# Patient Record
Sex: Female | Born: 1945 | Race: White | Hispanic: No | Smoking: Never smoker
Health system: Southern US, Community
[De-identification: ages and names within clinical notes are randomized; demographics above are authoritative.]

## PROBLEM LIST (undated history)

## (undated) DIAGNOSIS — F32A Depression, unspecified: Secondary | ICD-10-CM

## (undated) DIAGNOSIS — H25819 Combined forms of age-related cataract, unspecified eye: Secondary | ICD-10-CM

## (undated) DIAGNOSIS — M5126 Other intervertebral disc displacement, lumbar region: Secondary | ICD-10-CM

## (undated) DIAGNOSIS — N3000 Acute cystitis without hematuria: Secondary | ICD-10-CM

## (undated) DIAGNOSIS — R251 Tremor, unspecified: Secondary | ICD-10-CM

## (undated) DIAGNOSIS — E1122 Type 2 diabetes mellitus with diabetic chronic kidney disease: Secondary | ICD-10-CM

## (undated) DIAGNOSIS — M109 Gout, unspecified: Secondary | ICD-10-CM

## (undated) DIAGNOSIS — L409 Psoriasis, unspecified: Secondary | ICD-10-CM

## (undated) DIAGNOSIS — Z8659 Personal history of other mental and behavioral disorders: Secondary | ICD-10-CM

## (undated) DIAGNOSIS — R002 Palpitations: Secondary | ICD-10-CM

## (undated) DIAGNOSIS — H9190 Unspecified hearing loss, unspecified ear: Secondary | ICD-10-CM

## (undated) DIAGNOSIS — K760 Fatty (change of) liver, not elsewhere classified: Secondary | ICD-10-CM

## (undated) DIAGNOSIS — N183 Chronic kidney disease, stage 3 unspecified: Secondary | ICD-10-CM

## (undated) DIAGNOSIS — M48061 Spinal stenosis, lumbar region without neurogenic claudication: Secondary | ICD-10-CM

## (undated) DIAGNOSIS — M722 Plantar fascial fibromatosis: Secondary | ICD-10-CM

## (undated) DIAGNOSIS — M5417 Radiculopathy, lumbosacral region: Secondary | ICD-10-CM

## (undated) DIAGNOSIS — R351 Nocturia: Secondary | ICD-10-CM

## (undated) DIAGNOSIS — E039 Hypothyroidism, unspecified: Secondary | ICD-10-CM

## (undated) DIAGNOSIS — F319 Bipolar disorder, unspecified: Secondary | ICD-10-CM

## (undated) DIAGNOSIS — F329 Major depressive disorder, single episode, unspecified: Secondary | ICD-10-CM

## (undated) DIAGNOSIS — R259 Unspecified abnormal involuntary movements: Secondary | ICD-10-CM

## (undated) DIAGNOSIS — M1990 Unspecified osteoarthritis, unspecified site: Secondary | ICD-10-CM

## (undated) DIAGNOSIS — F40240 Claustrophobia: Secondary | ICD-10-CM

## (undated) DIAGNOSIS — R300 Dysuria: Secondary | ICD-10-CM

## (undated) DIAGNOSIS — E119 Type 2 diabetes mellitus without complications: Secondary | ICD-10-CM

## (undated) DIAGNOSIS — H353130 Nonexudative age-related macular degeneration, bilateral, stage unspecified: Secondary | ICD-10-CM

## (undated) DIAGNOSIS — N289 Disorder of kidney and ureter, unspecified: Secondary | ICD-10-CM

## (undated) DIAGNOSIS — I2510 Atherosclerotic heart disease of native coronary artery without angina pectoris: Secondary | ICD-10-CM

## (undated) DIAGNOSIS — R739 Hyperglycemia, unspecified: Secondary | ICD-10-CM

## (undated) DIAGNOSIS — H43819 Vitreous degeneration, unspecified eye: Secondary | ICD-10-CM

## (undated) DIAGNOSIS — M25559 Pain in unspecified hip: Secondary | ICD-10-CM

## (undated) DIAGNOSIS — H409 Unspecified glaucoma: Secondary | ICD-10-CM

## (undated) DIAGNOSIS — R911 Solitary pulmonary nodule: Secondary | ICD-10-CM

## (undated) DIAGNOSIS — H3530 Unspecified macular degeneration: Secondary | ICD-10-CM

## (undated) DIAGNOSIS — M5414 Radiculopathy, thoracic region: Secondary | ICD-10-CM

## (undated) DIAGNOSIS — R81 Glycosuria: Secondary | ICD-10-CM

## (undated) DIAGNOSIS — R479 Unspecified speech disturbances: Secondary | ICD-10-CM

## (undated) HISTORY — PX: TOTAL HIP ARTHROPLASTY: SHX124

## (undated) HISTORY — DX: Claustrophobia: F40.240

## (undated) HISTORY — DX: Chronic kidney disease, stage 3 unspecified: N18.30

## (undated) HISTORY — DX: Bipolar disorder, unspecified: F31.9

## (undated) HISTORY — DX: Unspecified abnormal involuntary movements: R25.9

## (undated) HISTORY — DX: Personal history of other mental and behavioral disorders: Z86.59

## (undated) HISTORY — DX: Disorder of kidney and ureter, unspecified: N28.9

## (undated) HISTORY — DX: Vitreous degeneration, unspecified eye: H43.819

## (undated) HISTORY — DX: Tremor, unspecified: R25.1

## (undated) HISTORY — DX: Solitary pulmonary nodule: R91.1

## (undated) HISTORY — DX: Type 2 diabetes mellitus without complications: E11.9

## (undated) HISTORY — PX: CATARACT EXTRACTION: SUR2

## (undated) HISTORY — PX: APPENDECTOMY: SHX54

## (undated) HISTORY — DX: Pain in unspecified hip: M25.559

## (undated) HISTORY — DX: Gout, unspecified: M10.9

## (undated) HISTORY — DX: Fatty (change of) liver, not elsewhere classified: K76.0

## (undated) HISTORY — DX: Hyperglycemia, unspecified: R73.9

## (undated) HISTORY — DX: Unspecified glaucoma: H40.9

## (undated) HISTORY — DX: Palpitations: R00.2

## (undated) HISTORY — DX: Nonexudative age-related macular degeneration, bilateral, stage unspecified: H35.3130

## (undated) HISTORY — DX: Unspecified osteoarthritis, unspecified site: M19.90

## (undated) HISTORY — DX: Other intervertebral disc displacement, lumbar region: M51.26

## (undated) HISTORY — DX: Spinal stenosis, lumbar region without neurogenic claudication: M48.061

## (undated) HISTORY — DX: Unspecified macular degeneration: H35.30

## (undated) HISTORY — DX: Plantar fascial fibromatosis: M72.2

## (undated) HISTORY — DX: Nocturia: R35.1

## (undated) HISTORY — DX: Glycosuria: R81

## (undated) HISTORY — DX: Atherosclerotic heart disease of native coronary artery without angina pectoris: I25.10

## (undated) HISTORY — DX: Unspecified speech disturbances: R47.9

## (undated) HISTORY — DX: Hypercalcemia: E83.52

## (undated) HISTORY — PX: OTHER SURGICAL HISTORY: SHX169

## (undated) HISTORY — DX: Type 2 diabetes mellitus with diabetic chronic kidney disease: E11.22

## (undated) HISTORY — PX: BACK SURGERY: SHX140

## (undated) HISTORY — DX: Unspecified hearing loss, unspecified ear: H91.90

## (undated) HISTORY — DX: Psoriasis, unspecified: L40.9

## (undated) HISTORY — DX: Chronic kidney disease, stage 3 (moderate): N18.3

## (undated) HISTORY — DX: Acute cystitis without hematuria: N30.00

## (undated) HISTORY — DX: Combined forms of age-related cataract, unspecified eye: H25.819

## (undated) HISTORY — DX: Radiculopathy, lumbosacral region: M54.17

## (undated) HISTORY — DX: Hypothyroidism, unspecified: E03.9

## (undated) HISTORY — DX: Major depressive disorder, single episode, unspecified: F32.9

## (undated) HISTORY — DX: Depression, unspecified: F32.A

## (undated) HISTORY — DX: Dysuria: R30.0

## (undated) HISTORY — DX: Radiculopathy, thoracic region: M54.14

## (undated) HISTORY — PX: NECK SURGERY: SHX720

---

## 2003-11-16 ENCOUNTER — Encounter: Payer: Self-pay | Admitting: Internal Medicine

## 2004-04-11 ENCOUNTER — Encounter: Payer: Self-pay | Admitting: Internal Medicine

## 2009-11-14 HISTORY — PX: LAMINECTOMY: SHX219

## 2010-12-09 LAB — HM PAP SMEAR: HM Pap smear: NORMAL

## 2011-03-12 LAB — HM COLONOSCOPY

## 2011-06-29 ENCOUNTER — Encounter: Payer: Self-pay | Admitting: Internal Medicine

## 2011-06-29 LAB — HM DIABETES EYE EXAM

## 2012-05-11 ENCOUNTER — Encounter: Payer: Self-pay | Admitting: Internal Medicine

## 2012-05-18 ENCOUNTER — Encounter: Payer: Self-pay | Admitting: Internal Medicine

## 2012-10-17 LAB — HM DIABETES EYE EXAM

## 2013-04-18 ENCOUNTER — Encounter: Payer: Self-pay | Admitting: Internal Medicine

## 2013-04-18 HISTORY — PX: HYSTEROSCOPY: SHX211

## 2013-04-27 LAB — HEPATIC FUNCTION PANEL
ALK PHOS: 104 (ref 25–125)
ALT: 31 (ref 7–35)
AST: 18 (ref 13–35)
Bilirubin, Total: 0.7

## 2013-04-27 LAB — BASIC METABOLIC PANEL
BUN: 12 (ref 4–21)
CREATININE: 1.4 — AB (ref 0.5–1.1)
Glucose: 154
POTASSIUM: 5.2 (ref 3.4–5.3)
Sodium: 139 (ref 137–147)

## 2013-05-18 ENCOUNTER — Encounter: Payer: Self-pay | Admitting: Internal Medicine

## 2013-07-10 LAB — HM MAMMOGRAPHY

## 2014-01-10 LAB — HEPATIC FUNCTION PANEL
ALK PHOS: 110 (ref 25–125)
ALT: 21 (ref 7–35)
AST: 18 (ref 13–35)
BILIRUBIN, TOTAL: 0.7

## 2014-01-10 LAB — BASIC METABOLIC PANEL
BUN: 15 (ref 4–21)
Creatinine: 1.5 — AB (ref 0.5–1.1)
GLUCOSE: 145
POTASSIUM: 4.9 (ref 3.4–5.3)
SODIUM: 144 (ref 137–147)

## 2014-01-10 LAB — TSH: TSH: 0.02 — AB (ref 0.41–5.90)

## 2014-05-25 ENCOUNTER — Encounter: Payer: Self-pay | Admitting: Internal Medicine

## 2014-07-10 LAB — HEPATIC FUNCTION PANEL
ALT: 24 (ref 7–35)
AST: 26 (ref 13–35)
Alkaline Phosphatase: 110 (ref 25–125)
BILIRUBIN, TOTAL: 0.7

## 2014-07-10 LAB — BASIC METABOLIC PANEL
BUN: 13 (ref 4–21)
Creatinine: 1.5 — AB (ref 0.5–1.1)
GLUCOSE: 140
Potassium: 5 (ref 3.4–5.3)
Sodium: 142 (ref 137–147)

## 2014-07-10 LAB — TSH: TSH: 3.86 (ref 0.41–5.90)

## 2014-07-11 ENCOUNTER — Encounter: Payer: Self-pay | Admitting: Internal Medicine

## 2014-11-23 ENCOUNTER — Encounter: Payer: Self-pay | Admitting: Internal Medicine

## 2014-12-10 ENCOUNTER — Encounter: Payer: Self-pay | Admitting: Internal Medicine

## 2017-06-02 ENCOUNTER — Encounter: Payer: Self-pay | Admitting: Internal Medicine

## 2017-07-12 LAB — HM MAMMOGRAPHY

## 2018-02-23 ENCOUNTER — Ambulatory Visit: Payer: Self-pay | Admitting: Internal Medicine

## 2018-03-03 ENCOUNTER — Other Ambulatory Visit: Payer: Self-pay

## 2018-03-08 ENCOUNTER — Encounter: Payer: Self-pay | Admitting: Internal Medicine

## 2018-03-08 ENCOUNTER — Ambulatory Visit (INDEPENDENT_AMBULATORY_CARE_PROVIDER_SITE_OTHER): Payer: Medicare Other | Admitting: Internal Medicine

## 2018-03-08 VITALS — BP 104/60 | HR 69 | Temp 97.9°F | Ht 64.0 in | Wt 240.6 lb

## 2018-03-08 DIAGNOSIS — E118 Type 2 diabetes mellitus with unspecified complications: Secondary | ICD-10-CM

## 2018-03-08 DIAGNOSIS — E032 Hypothyroidism due to medicaments and other exogenous substances: Secondary | ICD-10-CM

## 2018-03-08 DIAGNOSIS — Z794 Long term (current) use of insulin: Secondary | ICD-10-CM

## 2018-03-08 DIAGNOSIS — G4701 Insomnia due to medical condition: Secondary | ICD-10-CM | POA: Diagnosis not present

## 2018-03-08 DIAGNOSIS — E782 Mixed hyperlipidemia: Secondary | ICD-10-CM | POA: Insufficient documentation

## 2018-03-08 DIAGNOSIS — M48062 Spinal stenosis, lumbar region with neurogenic claudication: Secondary | ICD-10-CM | POA: Insufficient documentation

## 2018-03-08 DIAGNOSIS — F3342 Major depressive disorder, recurrent, in full remission: Secondary | ICD-10-CM

## 2018-03-08 DIAGNOSIS — N189 Chronic kidney disease, unspecified: Secondary | ICD-10-CM

## 2018-03-08 DIAGNOSIS — R600 Localized edema: Secondary | ICD-10-CM

## 2018-03-08 MED ORDER — ARIPIPRAZOLE 5 MG PO TABS: 5.0000 mg | ORAL_TABLET | Freq: Every day | ORAL | 6 refills | Status: DC

## 2018-03-08 MED ORDER — TRAZODONE HCL 50 MG PO TABS: 25.0000 mg | ORAL_TABLET | Freq: Every evening | ORAL | 3 refills | Status: DC | PRN

## 2018-03-08 NOTE — Patient Instructions (Addendum)
REDUCE ABILIFY TO 5MG   REDUCE TRAZODONE TO 25MG  AT BEDTIME  STOP ALPRAZOLAM  START OTC MELATONIN 6MG  AT BEDTIME  Continue other medications as ordered  Will call with lab results   Please schedule appt with mental health provider  Follow up in 1 month for depression

## 2018-03-08 NOTE — Progress Notes (Signed)
Patient ID: Bonna Steury, female   DOB: [birth year], 72 y.o.   MRN: [id number]   Location:  [facility] OFFICE  Provider: DR Arletha Grippe  Code Status:  Goals of Care: No flowsheet data found.   Chief Complaint  Patient presents with  . Establish Care    New patient establish care; Pt c/o nerve pain in thigh, toes and heel just on L leg; Pt has daughter Ander Purpura with her  . Medication Management    Pt brought all med's  . Medication Refill    Xanax    HPI: Patient is a 72 y.o. female seen today for as a new pt. She relocated [place] in Feb 2019. Briefly reviewed old records. She last saw PCP in Jan 2019. She saw Ortho in Jan 2019 and rec'd Toradol injection. She has severe central canal stenosis at L3-L4 above her fusion. She is a poor hitorian due to psych d/o. Hx obtained from chart.  She c/a left thigh nerve pain -->heel about 1 month ago. No known trauma or injury. She has chronic LBP. Last MRI "years" ago. She had back sx including rod placement about 5 yrs ago. She had lower cervical neck fusion about 20 yrs ago. She reports past hx DDD and possibly spinal stenosis. She takes gabapentin prn as it causes sedation/lethargy. She wonders if hemp would help her pain better?  Bipolar d/o, manic, moderate - she reports misdiagnosed and she no longer takes lithium. She has been taking celexa, abilify, and trazodone. She takes alprazolam to help her sleep. She would like to stop abilify. She was seeing psych in [place]  DM - BS 50-120s. SHE GETS LANTUS 40 UNITS DAILY AND januvia Last A1C 11.5%. Diet has become more healthy since relocation.  CKD - has seen nephrology in the past. 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 stable on levothyroxine  Hyperlipidemia - takes simvastatin  LE edema - stable on prn furosemide   Past Medical History:  Diagnosis Date  . Abnormal involuntary movement   . Acute cystitis   . Arthralgia of pelvic region and thigh   . Bilateral  nonexudative age-related macular degeneration   . Bipolar disorder ([place])   . Claustrophobia   . Combined form of senile cataract   . Coronary arteriosclerosis   . Decreased hearing   . Depression   . Diabetes ([place])   . Displacement of lumbar intervertebral disc without myelopathy   . Dysuria   . Glaucoma   . Glycosuria   . Gout   . History of mania   . Hypercalcemia   . Hyperglycemia   . Hypothyroidism   . Kidney disease   . Macular degeneration   . Nocturia   . 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 Palpitations   . Plantar fasciitis   . Psoriasis   . Solitary pulmonary nodule present on computed tomography of lung   . Speaking difficulty   . Spinal stenosis of lumbar region   . Stage 3 chronic renal impairment associated with type 2 diabetes mellitus ([place])   . Thoracic and lumbosacral neuritis   . Tremor   . Vitreous degeneration     Past Surgical History:  Procedure Laterality Date  . APPENDECTOMY    . BACK SURGERY     rods placed  . carpel tunnel     right in 1996, left in 1994  . CATARACT EXTRACTION    . HYSTEROSCOPY  04/18/2013  .  LAMINECTOMY  11/14/2009   L4-5 with fusion  . NECK SURGERY     4-5 disc  . TOTAL HIP ARTHROPLASTY Right      reports that she has never smoked. She has never used smokeless tobacco. She reports that she does not drink alcohol or use drugs. Social History   Socioeconomic History  . Marital status: Divorced    Spouse name: Not on file  . Number of children: Not on file  . Years of education: Not on file  . Highest education level: Not on file  Occupational History  . Not on file  Social Needs  . Financial resource strain: Not on file  . Food insecurity:    Worry: Not on file    Inability: Not on file  . Transportation needs:    Medical: Not on file    Non-medical: Not on file  Tobacco Use  . Smoking status: Never Smoker  . Smokeless tobacco: Never Used  Substance and Sexual  Activity  . Alcohol use: No    Frequency: Never  . Drug use: No  . Sexual activity: Not Currently  Lifestyle  . Physical activity:    Days per week: Not on file    Minutes per session: Not on file  . Stress: Not on file  Relationships  . Social connections:    Talks on phone: Not on file    Gets together: Not on file    Attends religious service: Not on file    Active member of club or organization: Not on file    Attends meetings of clubs or organizations: Not on file    Relationship status: Not on file  . Intimate partner violence:    Fear of current or ex partner: Not on file    Emotionally abused: Not on file    Physically abused: Not on file    Forced sexual activity: Not on file  Other Topics Concern  . Not on file  Social History Narrative   Social History      Diet?      Do you drink/eat things with caffeine? Coffee, soda      Marital status?            divorced                        What year were you married? 1969      Do you live in a house, apartment, assisted living, condo, trailer, etc.? house      Is it one or more stories? 2 story      How many persons live in your home? 2      Do you have any pets in your home? (please list) 1 dog, 3 cats      Highest level of education completed? Beautician school      Current or past profession: Insurance claims handler, bus driver (school)      Do you exercise?    no                                  Type & how often?      Advanced Directives      Do you have a living will? no      Do you have a DNR form?  If not, do you want to discuss one? no      Do you have signed POA/HPOA for forms?  yes      Functional Status      Do you have difficulty bathing or dressing yourself? no      Do you have difficulty preparing food or eating? no      Do you have difficulty managing your medications? no      Do you have difficulty managing your finances? no      Do you have difficulty affording  your medications? no    Family History  Problem Relation Age of Onset  . Peripheral Artery Disease Mother   . Cancer Father     Allergies  Allergen Reactions  . Metformin And Related Diarrhea  . Ultram [Tramadol] Other (See Comments)    falls  . Penicillins Rash    Outpatient Encounter Medications as of 03/08/2018  Medication Sig  . ALPRAZolam (XANAX) 0.5 MG tablet Take 0.5 mg by mouth daily.  . ARIPiprazole (ABILIFY) 10 MG tablet Take 10 mg by mouth daily.  . Ascorbic Acid (VITAMIN C) 1000 MG tablet Take 1,000 mg by mouth 2 (two) times daily.  . citalopram (CELEXA) 40 MG tablet Take 40 mg by mouth daily.  . clotrimazole (LOTRIMIN) 1 % cream Apply 1 application topically daily as needed.  . fluticasone (FLONASE) 50 MCG/ACT nasal spray Place 1 spray into both nostrils 2 (two) times daily.  . furosemide (LASIX) 20 MG tablet Take 20 mg by mouth daily.  Marland Kitchen gabapentin (NEURONTIN) 300 MG capsule Take 300 mg by mouth daily as needed.  . Insulin Glargine (LANTUS SOLOSTAR) 100 UNIT/ML Solostar Pen Inject 40 Units into the skin daily at 10 pm.  . levothyroxine (SYNTHROID, LEVOTHROID) 112 MCG tablet Take 112 mcg by mouth daily before breakfast.  . Multiple Vitamins-Minerals (PRESERVISION AREDS 2+MULTI VIT PO) Take 1 tablet by mouth 2 (two) times daily.  . simvastatin (ZOCOR) 40 MG tablet Take 40 mg by mouth daily.  . sitaGLIPtin (JANUVIA) 100 MG tablet Take 100 mg by mouth daily.  . traZODone (DESYREL) 100 MG tablet Take 100 mg by mouth at bedtime.   No facility-administered encounter medications on file as of 03/08/2018.     Review of Systems:  Review of Systems  HENT: Positive for dental problem ([place]), hearing loss and sinus pressure.        Dry mouth  Eyes: Positive for visual disturbance (due to macukar degeneration).       Dry eyes  Musculoskeletal: Positive for back pain.  Neurological: Positive for weakness and numbness.  Psychiatric/Behavioral: Positive for dysphoric mood and  sleep disturbance (insomnia). The patient is nervous/anxious (with panic attacks).   All other systems reviewed and are negative. per NP packet  Health Maintenance  Topic Date Due  . Hepatitis C Screening  1946-04-04  . DEXA SCAN  03/29/2011  . PNA vac Low Risk Adult (2 of 2 - PCV13) 08/10/2014  . INFLUENZA VACCINE  07/07/2018  . MAMMOGRAM  07/13/2019  . TETANUS/TDAP  12/09/2020  . COLONOSCOPY  03/11/2021    Physical Exam: Vitals:   03/08/18 0855  BP: 104/60  Pulse: 69  Temp: 97.9 F (36.6 C)  TempSrc: Oral  SpO2: 93%  Weight: 240 lb 9.6 oz (109.1 kg)  Height: '5\' 4"'$  (1.626 m)   Body mass index is 41.3 kg/m. Physical Exam  Constitutional: She is oriented to person, place, and time. She appears well-developed and well-nourished.  HENT:  Mouth/Throat: Oropharynx is clear and moist. No oropharyngeal exudate.  MMM; no oral thrush  Eyes: Pupils are equal, round, and reactive to light. No scleral icterus.  Neck: Neck supple. Carotid bruit is not present. No tracheal deviation present. No thyromegaly present.  Cardiovascular: Normal rate, regular rhythm, normal heart sounds and intact distal pulses. Exam reveals no gallop and no friction rub.  No murmur heard. +1 pitting LE edema b/l. No calf TTP; chronic venous stasis changes  Pulmonary/Chest: Effort normal and breath sounds normal. No stridor. No respiratory distress. She has no wheezes. She has no rales.  Abdominal: Soft. Normal appearance and bowel sounds are normal. She exhibits no distension and no mass. There is no hepatomegaly. There is no tenderness. There is no rigidity, no rebound and no guarding. No hernia.  Musculoskeletal: She exhibits edema.  Lymphadenopathy:    She has no cervical adenopathy.  Neurological: She is alert and oriented to person, place, and time.  Skin: Skin is warm and dry. No rash noted.  Psychiatric: She has a normal mood and affect. Her behavior is normal. Thought content normal.   Diabetic  Foot Exam - Simple   Simple Foot Form Diabetic Foot exam was performed with the following findings:  Yes 03/08/2018  9:56 AM  Visual Inspection See comments:  Yes Sensation Testing Intact to touch and monofilament testing bilaterally:  Yes Pulse Check Posterior Tibialis and Dorsalis pulse intact bilaterally:  Yes Comments (+) calluses b/l foot but no ulcerations; hammer toes present; b/l +1 pitting  BLE edema     Labs reviewed: Basic Metabolic Panel: No results for input(s): NA, K, CL, CO2, GLUCOSE, BUN, CREATININE, CALCIUM, MG, PHOS, TSH in the last 8760 hours. Liver Function Tests: No results for input(s): AST, ALT, ALKPHOS, BILITOT, PROT, ALBUMIN in the last 8760 hours. No results for input(s): LIPASE, AMYLASE in the last 8760 hours. No results for input(s): AMMONIA in the last 8760 hours. CBC: No results for input(s): WBC, NEUTROABS, HGB, HCT, MCV, PLT in the last 8760 hours. Lipid Panel: No results for input(s): CHOL, HDL, LDLCALC, TRIG, CHOLHDL, LDLDIRECT in the last 8760 hours. No results found for: HGBA1C  Procedures since last visit: No results found.  Assessment/Plan   ICD-10-CM   1. Recurrent major depressive disorder, in full remission ([place]) F33.42 ARIPiprazole (ABILIFY) 5 MG tablet  2. Insomnia due to medical condition G47.01 traZODone (DESYREL) 50 MG tablet  3. Hypothyroidism due to medication E03.2 TSH    T4, Free  4. Type 2 diabetes mellitus with complication, with long-term current use of insulin (HCC) E11.8 Hemoglobin A1c   Z79.4 CMP with eGFR([facility])    Microalbumin/Creatinine Ratio, Urine    Urinalysis with Reflex Microscopic    CBC with Differential/Platelets  5. Mixed hyperlipidemia E78.2 Lipid Panel  6. Bilateral lower extremity edema R60.0   7. Chronic kidney disease, unspecified CKD stage N18.9 CMP with eGFR([facility])    CBC with Differential/Platelets  8. Spinal stenosis of lumbar region with neurogenic claudication M48.062     Ok to use hemp as  directed for pain  REDUCE ABILIFY TO '5MG'$   REDUCE TRAZODONE TO '25MG'$  AT BEDTIME  STOP ALPRAZOLAM  START OTC MELATONIN '6MG'$  AT BEDTIME  Continue other medications as ordered  Will call with lab results   Please schedule appt with mental health provider  Old records reviewed  Follow up in 1 month for depression  Glade Strausser S. Naryiah Schley, [facility] [phone]  [place], [place] [address] [phone] Cell (Monday-Friday 8 AM - 5 PM) [phone] After 5 PM and follow prompts

## 2018-03-09 ENCOUNTER — Telehealth: Payer: Self-pay

## 2018-03-09 DIAGNOSIS — E870 Hyperosmolality and hypernatremia: Secondary | ICD-10-CM

## 2018-03-09 LAB — MICROALBUMIN / CREATININE URINE RATIO
Creatinine, Urine: 81 mg/dL (ref 20–275)
MICROALB/CREAT RATIO: 19 ug/mg{creat} (ref <30)
Microalb, Ur: 1.5 mg/dL

## 2018-03-09 LAB — LIPID PANEL
CHOL/HDL RATIO: 3.9 (calc) (ref <5.0)
Cholesterol: 170 mg/dL (ref <200)
HDL: 44 mg/dL — ABNORMAL LOW (ref >50)
LDL CHOLESTEROL (CALC): 105 mg/dL — AB
NON-HDL CHOLESTEROL (CALC): 126 mg/dL (ref <130)
TRIGLYCERIDES: 116 mg/dL (ref <150)

## 2018-03-09 LAB — URINALYSIS, ROUTINE W REFLEX MICROSCOPIC
Bacteria, UA: NONE SEEN /HPF
Bilirubin Urine: NEGATIVE
Glucose, UA: NEGATIVE
HYALINE CAST: NONE SEEN /LPF
Hgb urine dipstick: NEGATIVE
Ketones, ur: NEGATIVE
Nitrite: NEGATIVE
PROTEIN: NEGATIVE
Specific Gravity, Urine: 1.017 (ref 1.001–1.03)
pH: 7 (ref 5.0–8.0)

## 2018-03-09 LAB — CBC WITH DIFFERENTIAL/PLATELET
Basophils Absolute: 58 cells/uL (ref 0–200)
Basophils Relative: 0.8 %
EOS ABS: 360 {cells}/uL (ref 15–500)
Eosinophils Relative: 5 %
HEMATOCRIT: 38.3 % (ref 35.0–45.0)
Hemoglobin: 12.5 g/dL (ref 11.7–15.5)
LYMPHS ABS: 1872 {cells}/uL (ref 850–3900)
MCH: 27.8 pg (ref 27.0–33.0)
MCHC: 32.6 g/dL (ref 32.0–36.0)
MCV: 85.3 fL (ref 80.0–100.0)
MPV: 10.3 fL (ref 7.5–12.5)
Monocytes Relative: 5.6 %
NEUTROS PCT: 62.6 %
Neutro Abs: 4507 cells/uL (ref 1500–7800)
PLATELETS: 313 10*3/uL (ref 140–400)
RBC: 4.49 10*6/uL (ref 3.80–5.10)
RDW: 14.2 % (ref 11.0–15.0)
TOTAL LYMPHOCYTE: 26 %
WBC mixed population: 403 cells/uL (ref 200–950)
WBC: 7.2 10*3/uL (ref 3.8–10.8)

## 2018-03-09 LAB — COMPLETE METABOLIC PANEL WITH GFR
AG Ratio: 1.7 (calc) (ref 1.0–2.5)
ALBUMIN MSPROF: 4 g/dL (ref 3.6–5.1)
ALT: 14 U/L (ref 6–29)
AST: 15 U/L (ref 10–35)
Alkaline phosphatase (APISO): 104 U/L (ref 33–130)
BUN / CREAT RATIO: 17 (calc) (ref 6–22)
BUN: 25 mg/dL (ref 7–25)
CALCIUM: 9.1 mg/dL (ref 8.6–10.4)
CO2: 29 mmol/L (ref 20–32)
Chloride: 110 mmol/L (ref 98–110)
Creat: 1.48 mg/dL — ABNORMAL HIGH (ref 0.60–0.93)
GFR, EST NON AFRICAN AMERICAN: 35 mL/min/{1.73_m2} — AB (ref >=60)
GFR, Est African American: 41 mL/min/{1.73_m2} — ABNORMAL LOW (ref >=60)
GLUCOSE: 64 mg/dL — AB (ref 65–139)
Globulin: 2.4 g/dL (calc) (ref 1.9–3.7)
Potassium: 5 mmol/L (ref 3.5–5.3)
Sodium: 149 mmol/L — ABNORMAL HIGH (ref 135–146)
Total Bilirubin: 0.4 mg/dL (ref 0.2–1.2)
Total Protein: 6.4 g/dL (ref 6.1–8.1)

## 2018-03-09 LAB — HEMOGLOBIN A1C
Hgb A1c MFr Bld: 8 % of total Hgb — ABNORMAL HIGH (ref <5.7)
Mean Plasma Glucose: 183 (calc)
eAG (mmol/L): 10.1 (calc)

## 2018-03-09 LAB — T4, FREE: FREE T4: 1.4 ng/dL (ref 0.8–1.8)

## 2018-03-09 LAB — TSH: TSH: 0.57 mIU/L (ref 0.40–4.50)

## 2018-03-09 MED ORDER — SITAGLIPTIN PHOSPHATE 50 MG PO TABS: 50.0000 mg | ORAL_TABLET | Freq: Every day | ORAL | 6 refills | Status: DC

## 2018-03-09 NOTE — Telephone Encounter (Signed)
-----   Message from FlorenceMonica Carter, [place] sent at 03/09/2018  9:15 AM EDT ----- Diabetes control improving - continue lantus 40 units but will reduce januvia to 50mg  daily due to reduced kidney function (Januvia 50mg  #30 take 1 tab po daily with 6 RF); sodium elevated suggests dehydration - push water intake; repeat BMP in 1 week (re: hypernatremia) bad/LDL cholesterol borderline so watch fatty foods; other labs stable; continue other medications as ordered; follow up as scheduled

## 2018-03-09 NOTE — Telephone Encounter (Signed)
Discussed results with patient, patient verbalized understanding of results. Patient's daughter was also listening to call via speakerphone   1.) Scheduled appointment for 03/16/18 to recheck BMP- follow-up on sodium 2.) Copy of labs and cholesterol lowering diet mailed to patient 3.) Placed future order for BMP  4.) New rx for Januvia sent to pharmacy for decreased dosage

## 2018-03-15 ENCOUNTER — Other Ambulatory Visit: Payer: Self-pay

## 2018-03-15 ENCOUNTER — Other Ambulatory Visit: Payer: Medicare Other

## 2018-03-15 DIAGNOSIS — E870 Hyperosmolality and hypernatremia: Secondary | ICD-10-CM

## 2018-03-15 LAB — BASIC METABOLIC PANEL
BUN / CREAT RATIO: 17 (calc) (ref 6–22)
BUN: 29 mg/dL — AB (ref 7–25)
CO2: 26 mmol/L (ref 20–32)
Calcium: 9.4 mg/dL (ref 8.6–10.4)
Chloride: 105 mmol/L (ref 98–110)
Creat: 1.75 mg/dL — ABNORMAL HIGH (ref 0.60–0.93)
GLUCOSE: 94 mg/dL (ref 65–139)
Potassium: 4.9 mmol/L (ref 3.5–5.3)
SODIUM: 141 mmol/L (ref 135–146)

## 2018-03-15 NOTE — Telephone Encounter (Signed)
I am unsure of the dose for gabapentin. She will need to have last pharmacy send refill request

## 2018-03-15 NOTE — Telephone Encounter (Signed)
I called [facility] in [place] to confirm if patient ever had rx filled, no refill history on file for Gabapentin.  Left message on voicemail for patient to return call when available, reason for call: Patient needs to provide name and location of previous pharmacy for me to call and confirm, dose, instructions and dispense number.

## 2018-03-15 NOTE — Telephone Encounter (Signed)
Patient was in office for blood work and asked if she could get a refill on [facility] [place].  Dr.Carter please advise on dispense number, we have never filled medication before

## 2018-03-16 ENCOUNTER — Other Ambulatory Visit: Payer: Self-pay

## 2018-03-16 ENCOUNTER — Other Ambulatory Visit: Payer: Self-pay | Admitting: *Deleted

## 2018-03-16 MED ORDER — GABAPENTIN 300 MG PO CAPS: 300.0000 mg | ORAL_CAPSULE | Freq: Every day | ORAL | 3 refills | Status: DC | PRN

## 2018-03-16 NOTE — Telephone Encounter (Signed)
Patient called and requested refill. Faxed.  °

## 2018-03-17 ENCOUNTER — Other Ambulatory Visit: Payer: Self-pay

## 2018-03-17 DIAGNOSIS — N289 Disorder of kidney and ureter, unspecified: Secondary | ICD-10-CM

## 2018-03-25 ENCOUNTER — Ambulatory Visit
Admission: RE | Admit: 2018-03-25 | Discharge: 2018-03-25 | Disposition: A | Discharge status: Home / Self Care | Payer: Self-pay | Source: Ambulatory Visit | Attending: Internal Medicine | Admitting: Internal Medicine

## 2018-03-25 DIAGNOSIS — N289 Disorder of kidney and ureter, unspecified: Secondary | ICD-10-CM

## 2018-03-31 ENCOUNTER — Other Ambulatory Visit: Payer: Self-pay | Admitting: *Deleted

## 2018-03-31 DIAGNOSIS — F3342 Major depressive disorder, recurrent, in full remission: Secondary | ICD-10-CM

## 2018-03-31 MED ORDER — ARIPIPRAZOLE 5 MG PO TABS: 5.0000 mg | ORAL_TABLET | Freq: Every day | ORAL | 1 refills | Status: DC

## 2018-03-31 NOTE — Telephone Encounter (Signed)
[facility] [address] Requested 90 day supply

## 2018-04-12 ENCOUNTER — Other Ambulatory Visit: Payer: Self-pay | Admitting: Internal Medicine

## 2018-04-15 ENCOUNTER — Encounter: Payer: Self-pay | Admitting: Internal Medicine

## 2018-04-15 ENCOUNTER — Ambulatory Visit (INDEPENDENT_AMBULATORY_CARE_PROVIDER_SITE_OTHER): Payer: Medicare Other | Admitting: Internal Medicine

## 2018-04-15 VITALS — BP 132/68 | HR 67 | Temp 98.4°F | Resp 10 | Ht 64.0 in | Wt 231.0 lb

## 2018-04-15 DIAGNOSIS — F3342 Major depressive disorder, recurrent, in full remission: Secondary | ICD-10-CM | POA: Diagnosis not present

## 2018-04-15 DIAGNOSIS — R011 Cardiac murmur, unspecified: Secondary | ICD-10-CM | POA: Diagnosis not present

## 2018-04-15 DIAGNOSIS — R600 Localized edema: Secondary | ICD-10-CM | POA: Diagnosis not present

## 2018-04-15 DIAGNOSIS — Z794 Long term (current) use of insulin: Secondary | ICD-10-CM

## 2018-04-15 DIAGNOSIS — M48062 Spinal stenosis, lumbar region with neurogenic claudication: Secondary | ICD-10-CM

## 2018-04-15 DIAGNOSIS — E118 Type 2 diabetes mellitus with unspecified complications: Secondary | ICD-10-CM | POA: Diagnosis not present

## 2018-04-15 MED ORDER — GABAPENTIN 300 MG PO CAPS: 300.0000 mg | ORAL_CAPSULE | Freq: Every day | ORAL | 1 refills | Status: DC | PRN

## 2018-04-15 MED ORDER — ARIPIPRAZOLE 2 MG PO TABS: 2.0000 mg | ORAL_TABLET | Freq: Every day | ORAL | 1 refills | Status: DC

## 2018-04-15 NOTE — Progress Notes (Signed)
Patient ID: Jody [name], female   DOB: 09-16-1946, 72 y.o.   MRN: [id number]   Location:  [facility] OFFICE  Provider: DR Elmon Kirschner   Goals of Care: No flowsheet data found.   Chief Complaint  Patient presents with  . Medical Management of Chronic Issues    1 month folllow-up, here with daughter   . Medication Refill    Gabapentin     HPI: Patient is a 72 y.o. female seen today for f/u depression. Psych meds tapering off. abilify reduced to ; trazodone to ; stopped alprazolam last month. Melatonin  helps sleep. No CP, SOB, N/V, f/c. Weight down 9 lbs. She reports increased physical activity and diet is healthy. Feels better overall.  Bipolar d/o, manic, moderate - she reports misdiagnosed and she no longer takes lithium. She has been taking celexa, abilify, and trazodone. She takes alprazolam to help her sleep. She would like to stop abilify. She was seeing psych prior to relocation from [place]  Past Medical History:  Diagnosis Date  . Abnormal involuntary movement   . Acute cystitis   . Arthralgia of pelvic region and thigh   . Bilateral nonexudative age-related macular degeneration   . Bipolar disorder (HCC)   . Claustrophobia   . Combined form of senile cataract   . Coronary arteriosclerosis   . Decreased hearing   . Depression   . Diabetes (HCC)   . Displacement of lumbar intervertebral disc without myelopathy   . Dysuria   . Glaucoma   . Glycosuria   . Gout   . History of mania   . Hypercalcemia   . Hyperglycemia   . Hypothyroidism   . Kidney disease   . Macular degeneration   . Nocturia   . 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 Osteoarthritis    pelvic region and thigh  . Palpitations   . Plantar fasciitis   . Psoriasis   . Solitary pulmonary nodule present on computed tomography of lung   . Speaking difficulty   . Spinal stenosis of lumbar region   . Stage 3 chronic renal impairment associated with  type 2 diabetes mellitus (HCC)   . Thoracic and lumbosacral neuritis   . Tremor   . Vitreous degeneration     Past Surgical History:  Procedure Laterality Date  . APPENDECTOMY    . BACK SURGERY     rods placed  . carpel tunnel     right in 1996, left in 1994  . CATARACT EXTRACTION    . HYSTEROSCOPY  04/18/2013  . LAMINECTOMY  11/14/2009   L4-5 Lamfusion, [place] and repair dural tear  . NECK SURGERY     4-5 disc  . TOTAL HIP ARTHROPLASTY Right      reports that she has never smoked. She has never used smokeless tobacco. She reports that she does not drink alcohol or use drugs. Social History   Socioeconomic History  . Marital status: Divorced    Spouse name: Not on file  . Number of children: Not on file  . Years of education: Not on file  . Highest education level: Not on file  Occupational History  . Not on file  Social Needs  . Financial resource strain: Not on file  . Food insecurity:    Worry: Not on file    Inability: Not on file  . Transportation needs:    Medical: Not on file    Non-medical: Not on file  Tobacco  Use  . Smoking status: Never Smoker  . Smokeless tobacco: Never Used  Substance and Sexual Activity  . Alcohol use: No    Frequency: Never  . Drug use: No  . Sexual activity: Not Currently  Lifestyle  . Physical activity:    Days per week: Not on file    Minutes per session: Not on file  . Stress: Not on file  Relationships  . Social connections:    Talks on phone: Not on file    Gets together: Not on file    Attends religious service: Not on file    Active member of club or organization: Not on file    Attends meetings of clubs or organizations: Not on file    Relationship status: Not on file  . Intimate partner violence:    Fear of current or ex partner: Not on file    Emotionally abused: Not on file    Physically abused: Not on file    Forced sexual activity: Not on file  Other Topics Concern  . Not on file  Social History  Narrative   Social History      Diet?      Do you drink/eat things with caffeine? Coffee, soda      Marital status?            divorced                        What year were you married? 1969      Do you live in a house, apartment, assisted living, condo, trailer, etc.? house      Is it one or more stories? 2 story      How many persons live in your home? 2      Do you have any pets in your home? (please list) 1 dog, 3 cats      Highest level of education completed? Beautician school      Current or past profession: Tree surgeon, bus driver (school)      Do you exercise?    no                                  Type & how often?      Advanced Directives      Do you have a living will? no      Do you have a DNR form?                                  If not, do you want to discuss one? no      Do you have signed POA/HPOA for forms?  yes      Functional Status      Do you have difficulty bathing or dressing yourself? no      Do you have difficulty preparing food or eating? no      Do you have difficulty managing your medications? no      Do you have difficulty managing your finances? no      Do you have difficulty affording your medications? no    Family History  Problem Relation Age of Onset  . Peripheral Artery Disease Mother   . Cancer Father     Allergies  Allergen Reactions  . Metformin And Related Diarrhea  . Ultram [Tramadol] Other (See Comments)  falls  . Penicillins Rash    Outpatient Encounter Medications as of 04/15/2018  Medication Sig  . ALPRAZolam (XANAX) 0.5 MG tablet Take 0.5 mg by mouth daily.  . ARIPiprazole (ABILIFY) 5 MG tablet Take 1 tablet (5 mg total) by mouth daily.  . Ascorbic Acid (VITAMIN C) 1000 MG tablet Take 1,000 mg by mouth 2 (two) times daily.  . citalopram (CELEXA) 40 MG tablet Take 40 mg by mouth daily.  . clotrimazole (LOTRIMIN) 1 % cream Apply 1 application topically daily as needed.  . fluticasone (FLONASE) 50 MCG/ACT  nasal spray Place 1 spray into both nostrils 2 (two) times daily.  . furosemide (LASIX) 20 MG tablet Take 20 mg by mouth daily.  Marland Kitchen gabapentin (NEURONTIN) 300 MG capsule TAKE 1 CAPSULE (300 MG TOTAL) BY MOUTH DAILY AS NEEDED.  Marland Kitchen Insulin Glargine (LANTUS SOLOSTAR) 100 UNIT/ML Solostar Pen Inject 40 Units into the skin daily at 10 pm.  . levothyroxine (SYNTHROID, LEVOTHROID) 112 MCG tablet Take 112 mcg by mouth daily before breakfast.  . Multiple Vitamins-Minerals (PRESERVISION AREDS 2+MULTI VIT PO) Take 1 tablet by mouth 2 (two) times daily.  . simvastatin (ZOCOR) 40 MG tablet Take 40 mg by mouth daily.  . sitaGLIPtin (JANUVIA) 50 MG tablet Take 1 tablet (50 mg total) by mouth daily.  . traZODone (DESYREL) 50 MG tablet Take 0.5 tablets (25 mg total) by mouth at bedtime as needed for sleep.   No facility-administered encounter medications on file as of 04/15/2018.     Review of Systems:  Review of Systems  Psychiatric/Behavioral: Positive for sleep disturbance. Negative for confusion, dysphoric mood and hallucinations. The patient is not nervous/anxious.   All other systems reviewed and are negative.   Health Maintenance  Topic Date Due  . Hepatitis C Screening  [birth year]  . DEXA SCAN  03/29/2011  . OPHTHALMOLOGY EXAM  10/17/2013  . PNA vac Low Risk Adult (2 of 2 - PCV13) 08/10/2014  . INFLUENZA VACCINE  07/07/2018  . HEMOGLOBIN A1C  09/07/2018  . FOOT EXAM  03/09/2019  . URINE MICROALBUMIN  03/09/2019  . MAMMOGRAM  07/13/2019  . TETANUS/TDAP  12/09/2020  . COLONOSCOPY  03/11/2021    Physical Exam: Vitals:   04/15/18 1113  Pulse: 67  Temp: 98.4 F (36.9 C)  TempSrc: Oral  SpO2: 96%  Weight: 231 lb (104.8 kg)  Height:  (1.626 m)   Body mass index is 39.65 kg/m. Physical Exam  Constitutional: She is oriented to person, place, and time. She appears well-developed and well-nourished.  Cardiovascular:  Murmur (1/6 SEM) heard. Trace LE edema b/l. No calf TTP    Pulmonary/Chest: Effort normal and breath sounds normal. No stridor. No respiratory distress. She has no wheezes. She has no rales. She exhibits no tenderness.  Musculoskeletal: She exhibits edema.  Neurological: She is alert and oriented to person, place, and time.  Skin: Skin is warm and dry. No rash noted.  Psychiatric: She has a normal mood and affect. Her speech is normal and behavior is normal. Judgment and thought content normal.    Labs reviewed: Basic Metabolic Panel: Recent Labs    03/08/18 1006 03/15/18 1339  NA 149* 141  K 5.0 4.9  CL 110 105  CO2 29 26  GLUCOSE 64* 94  BUN 25 29*  CREATININE 1.48* 1.75*  CALCIUM 9.1 9.4  TSH 0.57  --    Liver Function Tests: Recent Labs    03/08/18 1006  AST 15  ALT 14  BILITOT  0.4  PROT 6.4   No results for input(s): LIPASE, AMYLASE in the last 8760 hours. No results for input(s): AMMONIA in the last 8760 hours. CBC: Recent Labs    03/08/18 1006  WBC 7.2  NEUTROABS 4,507  HGB 12.5  HCT 38.3  MCV 85.3  PLT 313   Lipid Panel: Recent Labs    03/08/18 1006  CHOL 170  HDL 44*  LDLCALC 105*  TRIG 116  CHOLHDL 3.9   Lab Results  Component Value Date   HGBA1C 8.0 (H) 03/08/2018    Procedures since last visit: US Renal  Result Date: 03/25/2018 CLINICAL DATA:  72 year old female with a history of abnormal kidney function EXAM: RENAL / URINARY TRACT ULTRASOUND COMPLETE COMPARISON:  None. FINDINGS: Right Kidney: Length: 10.0 cm. Cortical thinning of the right kidney with no hydronephrosis. No focal lesion. No echogenic foci. Left Kidney: Length: 10.4 cm. Cortical thinning of the left kidney. Anechoic structure at the superior left kidney measures 13 mm x 10 mm x 11 mm, compatible with a cyst. Bladder: Appears normal for degree of bladder distention. IMPRESSION: No evidence of hydronephrosis of the bilateral kidneys. Bilateral renal cortical thinning suggesting medical renal disease. Left renal cyst. Electronically  Signed   By: Gilmer Mor D.O.   On: 03/25/2018 16:57    Assessment/Plan   ICD-10-CM   1. Heart murmur R01.1 ECHOCARDIOGRAM LIMITED  2. Recurrent major depressive disorder, in full remission (HCC) F33.42 ARIPiprazole (ABILIFY) 2 MG tablet   GDR initiated 03/08/18 - cont tapering meds  3. Bilateral lower extremity edema R60.0 ECHOCARDIOGRAM LIMITED  4. Type 2 diabetes mellitus with complication, with long-term current use of insulin (HCC) E11.8 ECHOCARDIOGRAM LIMITED   - BS control improving Z79.4   5. Spinal stenosis of lumbar region with neurogenic claudication M48.062 gabapentin (NEURONTIN) 300 MG capsule   Reduce abilify to 2 mg daily  Reduce trazodone to 3 times per week (like Monday, Wednesday, Saturday)  Reduce lantus 36 units at bedtime  Continue other medications as ordered  Will call with heart [place] appt  Follow up in 1 month for depression, high risk med use    Duglas Heier S. Ancil Linsey  [facility] [address] [place], [place] [address] [phone] Cell (Monday-Friday 8 AM - 5 PM) [phone] After 5 PM and follow prompts

## 2018-04-15 NOTE — Patient Instructions (Addendum)
Reduce abilify to 2 mg daily  Reduce trazodone to 3 times per week (like Monday, Wednesday, Saturday)  Reduce lantus 36 units at bedtime  Continue other medications as ordered  Will call with heart [place] appt  Follow up in 1 month for depression, high risk med use

## 2018-04-18 ENCOUNTER — Encounter: Payer: Self-pay | Admitting: Internal Medicine

## 2018-04-22 ENCOUNTER — Other Ambulatory Visit: Payer: Self-pay | Admitting: Internal Medicine

## 2018-04-22 DIAGNOSIS — F3342 Major depressive disorder, recurrent, in full remission: Secondary | ICD-10-CM

## 2018-04-22 MED ORDER — ARIPIPRAZOLE 2 MG PO TABS: 2.0000 mg | ORAL_TABLET | Freq: Every day | ORAL | 1 refills | Status: DC

## 2018-04-22 NOTE — Addendum Note (Signed)
Addended by: Sueanne Margarita on: 04/22/2018 03:06 PM   Modules accepted: Orders

## 2018-05-09 ENCOUNTER — Other Ambulatory Visit: Payer: Self-pay | Admitting: *Deleted

## 2018-05-09 DIAGNOSIS — G4701 Insomnia due to medical condition: Secondary | ICD-10-CM

## 2018-05-09 MED ORDER — TRAZODONE HCL 50 MG PO TABS: 25.0000 mg | ORAL_TABLET | Freq: Every evening | ORAL | 3 refills | Status: DC | PRN

## 2018-05-09 MED ORDER — SIMVASTATIN 40 MG PO TABS: 40.0000 mg | ORAL_TABLET | Freq: Every day | ORAL | 3 refills | Status: DC

## 2018-05-09 NOTE — Telephone Encounter (Signed)
Patient requested refill

## 2018-05-17 ENCOUNTER — Encounter: Payer: Self-pay | Admitting: Internal Medicine

## 2018-05-17 ENCOUNTER — Ambulatory Visit (INDEPENDENT_AMBULATORY_CARE_PROVIDER_SITE_OTHER): Payer: Medicare Other | Admitting: Internal Medicine

## 2018-05-17 VITALS — BP 118/76 | HR 71 | Temp 98.5°F | Ht 64.0 in | Wt 228.0 lb

## 2018-05-17 DIAGNOSIS — Z794 Long term (current) use of insulin: Secondary | ICD-10-CM

## 2018-05-17 DIAGNOSIS — G4701 Insomnia due to medical condition: Secondary | ICD-10-CM | POA: Diagnosis not present

## 2018-05-17 DIAGNOSIS — F3342 Major depressive disorder, recurrent, in full remission: Secondary | ICD-10-CM | POA: Diagnosis not present

## 2018-05-17 DIAGNOSIS — R253 Fasciculation: Secondary | ICD-10-CM

## 2018-05-17 DIAGNOSIS — Z79899 Other long term (current) drug therapy: Secondary | ICD-10-CM | POA: Diagnosis not present

## 2018-05-17 DIAGNOSIS — E118 Type 2 diabetes mellitus with unspecified complications: Secondary | ICD-10-CM

## 2018-05-17 DIAGNOSIS — E782 Mixed hyperlipidemia: Secondary | ICD-10-CM

## 2018-05-17 DIAGNOSIS — E032 Hypothyroidism due to medicaments and other exogenous substances: Secondary | ICD-10-CM | POA: Diagnosis not present

## 2018-05-17 MED ORDER — ALPRAZOLAM 0.25 MG PO TABS
0.2500 mg | ORAL_TABLET | Freq: Every day | ORAL | 0 refills | Status: DC
Start: 2018-05-17 — End: 2018-06-14

## 2018-05-17 MED ORDER — INSULIN GLARGINE 100 UNIT/ML SOLOSTAR PEN: 30.0000 [IU] | PEN_INJECTOR | Freq: Every day | SUBCUTANEOUS | 11 refills | Status: DC

## 2018-05-17 NOTE — Progress Notes (Signed)
Patient ID: Jody [name], female   DOB: 26-Nov-1946, 72 y.o.   MRN: [id number]   Location:  [facility] OFFICE  Provider: DR Arletha Grippe  Code Status:  Goals of Care: No flowsheet data found.   Chief Complaint  Patient presents with  . Medical Management of Chronic Issues    1 month follow-up on depression and High Risk medication use. Restless leg (right) through out the day     HPI: Patient is a 72 y.o. female seen today for depression f/u and high risk med use. GDR initiated 03/08/18. She is now off abilify. She takes trazodone 46m qhs with melatonin but is still not sleeping well. She began to have generalized muscle twitches a few weeks ago. She is off BZD x > 1 month. BS 70-80s. Weight down 3 lbs. She has increased exercise and is swimming a few days per week and watching diet.  Bipolar d/o, manic, moderate. MDD in full remission - she reports misdiagnosed and she no longer takes lithium. She was taking celexa, abilify, and trazodone. She took alprazolam to help her sleep. GDR initiated 03/08/18 and she is now off abilify and alprazolam; trazodone at lowest dose 255m She has trouble sleeping and spends a lot of time excessively worrying prior to falling asleep. She was seeing psych prior to relocation from [place]    Past Medical History:  Diagnosis Date  . Abnormal involuntary movement   . Acute cystitis   . Arthralgia of pelvic region and thigh   . Bilateral nonexudative age-related macular degeneration   . Bipolar disorder (HCCoaling  . Claustrophobia   . Combined form of senile cataract   . Coronary arteriosclerosis   . Decreased hearing   . Depression   . Diabetes ([place]  . Displacement of lumbar intervertebral disc without myelopathy   . Dysuria   . Glaucoma   . Glycosuria   . Gout   . History of mania   . Hypercalcemia   . Hyperglycemia   . Hypothyroidism   . Kidney disease   . Macular degeneration   . Nocturia   . Nonalcoholic fatty liver disease without nonalcoholic  steatohepatitis (NASH)   . Osteoarthritis    pelvic region and thigh  . Palpitations   . Plantar fasciitis   . Psoriasis   . Solitary pulmonary nodule present on computed tomography of lung   . Speaking difficulty   . Spinal stenosis of lumbar region   . Stage 3 chronic renal impairment associated with type 2 diabetes mellitus ([place]  . Thoracic and lumbosacral neuritis   . Tremor   . Vitreous degeneration     Past Surgical History:  Procedure Laterality Date  . APPENDECTOMY    . BACK SURGERY     rods placed  . carpel tunnel     right in 1996, left in 1994  . CATARACT EXTRACTION    . HYSTEROSCOPY  04/18/2013  . LAMINECTOMY  11/14/2009   L4-5 Lamfusion, [place] and repair dural tear  . NECK SURGERY     4-5 disc  . TOTAL HIP ARTHROPLASTY Right      reports that she has never smoked. She has never used smokeless tobacco. She reports that she does not drink alcohol or use drugs. Social History   Socioeconomic History  . Marital status: Divorced    Spouse name: Not on file  . Number of children: Not on file  . Years of education: Not on file  . Highest education  level: Not on file  Occupational History  . Not on file  Social Needs  . Financial resource strain: Not on file  . Food insecurity:    Worry: Not on file    Inability: Not on file  . Transportation needs:    Medical: Not on file    Non-medical: Not on file  Tobacco Use  . Smoking status: Never Smoker  . Smokeless tobacco: Never Used  Substance and Sexual Activity  . Alcohol use: No    Frequency: Never  . Drug use: No  . Sexual activity: Not Currently  Lifestyle  . Physical activity:    Days per week: Not on file    Minutes per session: Not on file  . Stress: Not on file  Relationships  . Social connections:    Talks on phone: Not on file    Gets together: Not on file    Attends religious service: Not on file    Active member of club or organization: Not on file    Attends meetings of clubs or  organizations: Not on file    Relationship status: Not on file  . Intimate partner violence:    Fear of current or ex partner: Not on file    Emotionally abused: Not on file    Physically abused: Not on file    Forced sexual activity: Not on file  Other Topics Concern  . Not on file  Social History Narrative   Social History      Diet?      Do you drink/eat things with caffeine? Coffee, soda      Marital status?            divorced                        What year were you married? 1969      Do you live in a house, apartment, assisted living, condo, trailer, etc.? house      Is it one or more stories? 2 story      How many persons live in your home? 2      Do you have any pets in your home? (please list) 1 dog, 3 cats      Highest level of education completed? Beautician school      Current or past profession: Insurance claims handler, bus driver (school)      Do you exercise?    no                                  Type & how often?      Advanced Directives      Do you have a living will? no      Do you have a DNR form?                                  If not, do you want to discuss one? no      Do you have signed POA/HPOA for forms?  yes      Functional Status      Do you have difficulty bathing or dressing yourself? no      Do you have difficulty preparing food or eating? no      Do you have difficulty managing your medications? no      Do you have  difficulty managing your finances? no      Do you have difficulty affording your medications? no    Family History  Problem Relation Age of Onset  . Peripheral Artery Disease Mother   . Cancer Father     Allergies  Allergen Reactions  . Metformin And Related Diarrhea  . Ultram [Tramadol] Other (See Comments)    falls  . Penicillins Rash    Outpatient Encounter Medications as of 05/17/2018  Medication Sig  . Ascorbic Acid (VITAMIN C) 1000 MG tablet Take 1,000 mg by mouth 2 (two) times daily.  . cetirizine (ZYRTEC) 10  MG tablet Take 10 mg by mouth daily.  . citalopram (CELEXA) 40 MG tablet Take 40 mg by mouth daily.  . clotrimazole (LOTRIMIN) 1 % cream Apply 1 application topically daily as needed.  . furosemide (LASIX) 20 MG tablet Take 20 mg by mouth as needed.   . gabapentin (NEURONTIN) 300 MG capsule Take 1 capsule (300 mg total) by mouth daily as needed.  . Insulin Glargine (LANTUS SOLOSTAR) 100 UNIT/ML Solostar Pen Inject 36 Units into the skin daily at 10 pm.   . levothyroxine (SYNTHROID, LEVOTHROID) 112 MCG tablet Take 112 mcg by mouth daily before breakfast.  . simvastatin (ZOCOR) 40 MG tablet Take 1 tablet (40 mg total) by mouth daily.  . sitaGLIPtin (JANUVIA) 50 MG tablet Take 1 tablet (50 mg total) by mouth daily.  . traZODone (DESYREL) 50 MG tablet Take 0.5 tablets (25 mg total) by mouth at bedtime as needed for sleep.  . [DISCONTINUED] ALPRAZolam (XANAX) 0.5 MG tablet Take 0.5 mg by mouth daily.  . [DISCONTINUED] ARIPiprazole (ABILIFY) 2 MG tablet Take 1 tablet (2 mg total) by mouth daily.  . [DISCONTINUED] fluticasone (FLONASE) 50 MCG/ACT nasal spray Place 1 spray into both nostrils 2 (two) times daily.  . [DISCONTINUED] Multiple Vitamins-Minerals (PRESERVISION AREDS 2+MULTI VIT PO) Take 1 tablet by mouth 2 (two) times daily.   No facility-administered encounter medications on file as of 05/17/2018.     Review of Systems:  Review of Systems  Psychiatric/Behavioral: Positive for sleep disturbance. Negative for agitation, confusion and hallucinations. The patient is nervous/anxious.   All other systems reviewed and are negative.   Health Maintenance  Topic Date Due  . Hepatitis C Screening  December 06, 1946  . DEXA SCAN  03/29/2011  . OPHTHALMOLOGY EXAM  10/17/2013  . PNA vac Low Risk Adult (2 of 2 - PCV13) 08/10/2014  . INFLUENZA VACCINE  07/07/2018  . HEMOGLOBIN A1C  09/07/2018  . FOOT EXAM  03/09/2019  . URINE MICROALBUMIN  03/09/2019  . MAMMOGRAM  07/13/2019  . TETANUS/TDAP  12/09/2020   . COLONOSCOPY  03/11/2021    Physical Exam: Vitals:   05/17/18 1546  BP: 118/76  Pulse: 71  Temp: 98.5 F (36.9 C)  TempSrc: Oral  SpO2: 95%  Weight: 228 lb (103.4 kg)  Height: 5' 4"  (1.626 m)   Body mass index is 39.14 kg/m. Physical Exam  Cardiovascular: Normal rate, regular rhythm and intact distal pulses.  Murmur (1/6 SEM) heard. No LE edema b/l. No calf TTP or palpable nodules  Pulmonary/Chest: Effort normal and breath sounds normal. No stridor. No respiratory distress. She has no wheezes. She has no rales. She exhibits no tenderness.  Skin: Skin is warm and dry. No rash noted.  Psychiatric: She has a normal mood and affect. Her behavior is normal. Judgment and thought content normal.    Labs reviewed: Basic Metabolic Panel: Recent Labs  03/08/18 1006 03/15/18 1339  NA 149* 141  K 5.0 4.9  CL 110 105  CO2 29 26  GLUCOSE 64* 94  BUN 25 29*  CREATININE 1.48* 1.75*  CALCIUM 9.1 9.4  TSH 0.57  --    Liver Function Tests: Recent Labs    03/08/18 1006  AST 15  ALT 14  BILITOT 0.4  PROT 6.4   No results for input(s): LIPASE, AMYLASE in the last 8760 hours. No results for input(s): AMMONIA in the last 8760 hours. CBC: Recent Labs    03/08/18 1006  WBC 7.2  NEUTROABS 4,507  HGB 12.5  HCT 38.3  MCV 85.3  PLT 313   Lipid Panel: Recent Labs    03/08/18 1006  CHOL 170  HDL 44*  LDLCALC 105*  TRIG 116  CHOLHDL 3.9   Lab Results  Component Value Date   HGBA1C 8.0 (H) 03/08/2018    Procedures since last visit: No results found.  Assessment/Plan   ICD-10-CM   1. High risk medication use Z79.899 CMP with eGFR([facility])  2. Muscle twitching R25.3   3. Insomnia due to medical condition G47.01 ALPRAZolam (XANAX) 0.25 MG tablet  4. Recurrent major depressive disorder, in full remission ([place]) F33.42 ALPRAZolam (XANAX) 0.25 MG tablet  5. Type 2 diabetes mellitus with complication, with long-term current use of insulin (HCC) E11.8 Insulin Glargine  (LANTUS SOLOSTAR) 100 UNIT/ML Solostar Pen   Z79.4 Hemoglobin A1c  6. Hypothyroidism due to medication E03.2 TSH  7. Mixed hyperlipidemia E78.2 Lipid Panel   She is now on lowest dose of trazodone. She continues to excessively worry at night which interrupts her sleep despite melatonin. Will resume alprazolam at lowest effective dose which will likely help muscle twitches.   RESUME ALPRAZOLAM 0.25MG AT BEDTIME WITH TRAZODONE 25MG +/- MELATONIN  DECREASE LANTUS 30 UNITS AT BEDTIME  Continue other medications as ordered  Follow up in 1 month for depression, insomnia, DM. Fasting labs prior to appt   Jody [name]  [facility] [address] [place], [place] [address] [phone] Cell (Monday-Friday 8 AM - 5 PM) [phone] After 5 PM and follow prompts

## 2018-05-17 NOTE — Patient Instructions (Signed)
RESUME ALPRAZOLAM 0.25MG  AT BEDTIME WITH TRAZODONE 25MG  +/- MELATONIN  DECREASE LANTUS 30 UNITS AT BEDTIME  Continue other medications as ordered  Follow up in 1 month for depression, insomnia, DM. Fasting labs prior to appt.

## 2018-05-18 ENCOUNTER — Telehealth: Payer: Self-pay

## 2018-05-18 NOTE — Telephone Encounter (Signed)
Late Entry:  I left message yesterday with Cardiology [facility]) requesting to schedule patient for Echocardiogram, order was placed in May 2019 and for an unknown reason it did not populate into the orderables workque.  Dr.Carter inquired about order while patient was in office yesterday.   Rene KocherRegina returned call this morning, Patient is scheduled for Monday at 3:00 pm, patient's daughter was informed.

## 2018-05-23 ENCOUNTER — Other Ambulatory Visit: Payer: Self-pay

## 2018-05-23 ENCOUNTER — Ambulatory Visit (HOSPITAL_COMMUNITY): Payer: Medicare Other | Attending: Cardiology

## 2018-05-23 DIAGNOSIS — R011 Cardiac murmur, unspecified: Secondary | ICD-10-CM | POA: Diagnosis not present

## 2018-05-23 DIAGNOSIS — R600 Localized edema: Secondary | ICD-10-CM | POA: Insufficient documentation

## 2018-05-23 DIAGNOSIS — E119 Type 2 diabetes mellitus without complications: Secondary | ICD-10-CM | POA: Insufficient documentation

## 2018-05-23 DIAGNOSIS — Z794 Long term (current) use of insulin: Secondary | ICD-10-CM | POA: Diagnosis not present

## 2018-05-23 DIAGNOSIS — I350 Nonrheumatic aortic (valve) stenosis: Secondary | ICD-10-CM | POA: Insufficient documentation

## 2018-05-23 DIAGNOSIS — E118 Type 2 diabetes mellitus with unspecified complications: Secondary | ICD-10-CM | POA: Insufficient documentation

## 2018-05-27 ENCOUNTER — Other Ambulatory Visit: Payer: Self-pay

## 2018-05-27 DIAGNOSIS — R011 Cardiac murmur, unspecified: Secondary | ICD-10-CM

## 2018-05-27 DIAGNOSIS — I517 Cardiomegaly: Secondary | ICD-10-CM

## 2018-05-27 DIAGNOSIS — R600 Localized edema: Secondary | ICD-10-CM

## 2018-06-06 ENCOUNTER — Other Ambulatory Visit: Payer: Self-pay | Admitting: *Deleted

## 2018-06-06 MED ORDER — CITALOPRAM HYDROBROMIDE 40 MG PO TABS: 40.0000 mg | ORAL_TABLET | Freq: Every day | ORAL | 0 refills | Status: DC

## 2018-06-06 MED ORDER — LEVOTHYROXINE SODIUM 112 MCG PO TABS: 112.0000 ug | ORAL_TABLET | Freq: Every day | ORAL | 0 refills | Status: DC

## 2018-06-10 ENCOUNTER — Other Ambulatory Visit: Payer: Medicare Other

## 2018-06-10 DIAGNOSIS — E118 Type 2 diabetes mellitus with unspecified complications: Secondary | ICD-10-CM

## 2018-06-10 DIAGNOSIS — E032 Hypothyroidism due to medicaments and other exogenous substances: Secondary | ICD-10-CM

## 2018-06-10 DIAGNOSIS — Z79899 Other long term (current) drug therapy: Secondary | ICD-10-CM

## 2018-06-10 DIAGNOSIS — E782 Mixed hyperlipidemia: Secondary | ICD-10-CM

## 2018-06-10 DIAGNOSIS — Z794 Long term (current) use of insulin: Secondary | ICD-10-CM

## 2018-06-11 LAB — LIPID PANEL
Cholesterol: 190 mg/dL (ref <200)
HDL: 41 mg/dL — AB (ref >50)
LDL Cholesterol (Calc): 122 mg/dL (calc) — ABNORMAL HIGH
Non-HDL Cholesterol (Calc): 149 mg/dL (calc) — ABNORMAL HIGH (ref <130)
TRIGLYCERIDES: 152 mg/dL — AB (ref <150)
Total CHOL/HDL Ratio: 4.6 (calc) (ref <5.0)

## 2018-06-11 LAB — COMPLETE METABOLIC PANEL WITH GFR
AG RATIO: 2.3 (calc) (ref 1.0–2.5)
ALBUMIN MSPROF: 4.1 g/dL (ref 3.6–5.1)
ALT: 12 U/L (ref 6–29)
AST: 13 U/L (ref 10–35)
Alkaline phosphatase (APISO): 100 U/L (ref 33–130)
BILIRUBIN TOTAL: 0.5 mg/dL (ref 0.2–1.2)
BUN / CREAT RATIO: 17 (calc) (ref 6–22)
BUN: 24 mg/dL (ref 7–25)
CHLORIDE: 109 mmol/L (ref 98–110)
CO2: 22 mmol/L (ref 20–32)
Calcium: 9.2 mg/dL (ref 8.6–10.4)
Creat: 1.39 mg/dL — ABNORMAL HIGH (ref 0.60–0.93)
GFR, EST AFRICAN AMERICAN: 44 mL/min/{1.73_m2} — AB (ref >=60)
GFR, EST NON AFRICAN AMERICAN: 38 mL/min/{1.73_m2} — AB (ref >=60)
Globulin: 1.8 g/dL (calc) — ABNORMAL LOW (ref 1.9–3.7)
Glucose, Bld: 103 mg/dL — ABNORMAL HIGH (ref 65–99)
Potassium: 4.6 mmol/L (ref 3.5–5.3)
Sodium: 141 mmol/L (ref 135–146)
Total Protein: 5.9 g/dL — ABNORMAL LOW (ref 6.1–8.1)

## 2018-06-11 LAB — TSH: TSH: 0.15 m[IU]/L — AB (ref 0.40–4.50)

## 2018-06-11 LAB — HEMOGLOBIN A1C
HEMOGLOBIN A1C: 5.7 %{Hb} — AB (ref <5.7)
MEAN PLASMA GLUCOSE: 117 (calc)
eAG (mmol/L): 6.5 (calc)

## 2018-06-14 ENCOUNTER — Ambulatory Visit (INDEPENDENT_AMBULATORY_CARE_PROVIDER_SITE_OTHER): Payer: Medicare Other | Admitting: Internal Medicine

## 2018-06-14 ENCOUNTER — Encounter: Payer: Self-pay | Admitting: Internal Medicine

## 2018-06-14 VITALS — BP 130/68 | HR 72 | Temp 98.1°F | Resp 18 | Ht 64.0 in | Wt 221.0 lb

## 2018-06-14 DIAGNOSIS — G4701 Insomnia due to medical condition: Secondary | ICD-10-CM

## 2018-06-14 DIAGNOSIS — E118 Type 2 diabetes mellitus with unspecified complications: Secondary | ICD-10-CM | POA: Diagnosis not present

## 2018-06-14 DIAGNOSIS — F411 Generalized anxiety disorder: Secondary | ICD-10-CM

## 2018-06-14 DIAGNOSIS — F3342 Major depressive disorder, recurrent, in full remission: Secondary | ICD-10-CM

## 2018-06-14 DIAGNOSIS — L259 Unspecified contact dermatitis, unspecified cause: Secondary | ICD-10-CM

## 2018-06-14 DIAGNOSIS — L409 Psoriasis, unspecified: Secondary | ICD-10-CM

## 2018-06-14 DIAGNOSIS — Z794 Long term (current) use of insulin: Secondary | ICD-10-CM | POA: Diagnosis not present

## 2018-06-14 MED ORDER — BUSPIRONE HCL 10 MG PO TABS: 10.0000 mg | ORAL_TABLET | Freq: Three times a day (TID) | ORAL | 1 refills | Status: DC

## 2018-06-14 MED ORDER — ALPRAZOLAM 0.25 MG PO TABS: 0.2500 mg | ORAL_TABLET | Freq: Every day | ORAL | 0 refills | Status: DC

## 2018-06-14 MED ORDER — TRIAMCINOLONE ACETONIDE 0.1 % EX LOTN: 1.0000 "application " | TOPICAL_LOTION | Freq: Every day | CUTANEOUS | 6 refills | Status: AC

## 2018-06-14 NOTE — Patient Instructions (Addendum)
START BUSPAR 10MG  3 TIMES DAILY FOR ANXIETY  TAKE INSULIN 30 UNITS DAILY AS ORDERED  Continue other medications as ordered  Continue diet and exercise program  Follow up in 1 month for anxiety, DM or sooner if need be

## 2018-06-14 NOTE — Progress Notes (Signed)
Patient ID: Jody [name], female   DOB: [birth year], 72 y.o.   MRN: [id number]   Location:  [facility] OFFICE  Provider: DR Elmon Kirschner  Code Status:  Goals of Care: No flowsheet data found.   Chief Complaint  Patient presents with  . Medical Management of Chronic Issues    3 mo f/u- depression, insominia, diaetes    HPI: Patient is a 72 y.o. female seen today for f/u of MDD, insomnia and DM. Pt reports she is unable to quiet her mind and is constantly worrying about things. She is unable to sleep at night as her mind is always racing. She increased her trazodone to 50mg  qhs and takes melatonin and alprazolam without relief. She remains off abilify. She continues to have muscle twitches. She exercises several times per week and tries to stay active at home.   She noticed an itchy rash on her abdomen x several weeks. She denies insect bites. Found temporary relief with topical OTC steroid cream. She uses triamcinolone lotion for her scalp psoriasis. She takes zyrtec daily.  Her daughter and granddaughter are present.  Lab results reviewed with pt - TSH 0.15; A1c 5.7%; LDL 122; HDL 41; Cr 1.39  DM - she noticed BS increased as high as 160 on 30 units of lantus so she increased dose to 34 units to keep her BS in 70s. Weight down 7 lbs since last month.   Bipolar d/o, manic, moderate. MDD in full remission - she reports misdiagnosed and she no longer takes lithium. She was taking celexa, abilify, and trazodone. She took alprazolam to help her sleep. GDR initiated 03/08/18 and she is now off abilify but had to resume alprazolam and increase trazodone to 50mg . She was seeing psych prior to relocation from [place]    Past Medical History:  Diagnosis Date  . Abnormal involuntary movement   . Acute cystitis   . Arthralgia of pelvic region and thigh   . Bilateral nonexudative age-related macular degeneration   . Bipolar disorder (HCC)   . Claustrophobia   . Combined form of senile cataract   .  Coronary arteriosclerosis   . Decreased hearing   . Depression   . Diabetes (HCC)   . Displacement of lumbar intervertebral disc without myelopathy   . Dysuria   . Glaucoma   . Glycosuria   . Gout   . History of mania   . Hypercalcemia   . Hyperglycemia   . Hypothyroidism   . Kidney disease   . Macular degeneration   . Nocturia   . 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 Osteoarthritis    pelvic region and thigh  . Palpitations   . Plantar fasciitis   . Psoriasis   . Solitary pulmonary nodule present on computed tomography of lung   . Speaking difficulty   . Spinal stenosis of lumbar region   . Stage 3 chronic renal impairment associated with type 2 diabetes mellitus (HCC)   . Thoracic and lumbosacral neuritis   . Tremor   . Vitreous degeneration     Past Surgical History:  Procedure Laterality Date  . APPENDECTOMY    . BACK SURGERY     rods placed  . carpel tunnel     right in 1996, left in 1994  . CATARACT EXTRACTION    . HYSTEROSCOPY  04/18/2013  . LAMINECTOMY  11/14/2009   L4-5 Lamfusion, [place] and repair dural tear  . NECK SURGERY  4-5 disc  . TOTAL HIP ARTHROPLASTY Right      reports that she has never smoked. She has never used smokeless tobacco. She reports that she does not drink alcohol or use drugs. Social History   Socioeconomic History  . Marital status: Divorced    Spouse name: Not on file  . Number of children: Not on file  . Years of education: Not on file  . Highest education level: Not on file  Occupational History  . Not on file  Social Needs  . Financial resource strain: Not on file  . Food insecurity:    Worry: Not on file    Inability: Not on file  . Transportation needs:    Medical: Not on file    Non-medical: Not on file  Tobacco Use  . Smoking status: Never Smoker  . Smokeless tobacco: Never Used  Substance and Sexual Activity  . Alcohol use: No    Frequency: Never  . Drug  use: No  . Sexual activity: Not Currently  Lifestyle  . Physical activity:    Days per week: Not on file    Minutes per session: Not on file  . Stress: Not on file  Relationships  . Social connections:    Talks on phone: Not on file    Gets together: Not on file    Attends religious service: Not on file    Active member of club or organization: Not on file    Attends meetings of clubs or organizations: Not on file    Relationship status: Not on file  . Intimate partner violence:    Fear of current or ex partner: Not on file    Emotionally abused: Not on file    Physically abused: Not on file    Forced sexual activity: Not on file  Other Topics Concern  . Not on file  Social History Narrative   Social History      Diet?      Do you drink/eat things with caffeine? Coffee, soda      Marital status?            divorced                        What year were you married? 1969      Do you live in a house, apartment, assisted living, condo, trailer, etc.? house      Is it one or more stories? 2 story      How many persons live in your home? 2      Do you have any pets in your home? (please list) 1 dog, 3 cats      Highest level of education completed? Beautician school      Current or past profession: Tree surgeon, bus driver (school)      Do you exercise?    no                                  Type & how often?      Advanced Directives      Do you have a living will? no      Do you have a DNR form?                                  If not, do you  want to discuss one? no      Do you have signed POA/HPOA for forms?  yes      Functional Status      Do you have difficulty bathing or dressing yourself? no      Do you have difficulty preparing food or eating? no      Do you have difficulty managing your medications? no      Do you have difficulty managing your finances? no      Do you have difficulty affording your medications? no    Family History  Problem Relation  Age of Onset  . Peripheral Artery Disease Mother   . Cancer Father     Allergies  Allergen Reactions  . Metformin And Related Diarrhea  . Ultram [Tramadol] Other (See Comments)    falls  . Penicillins Rash    Outpatient Encounter Medications as of 06/14/2018  Medication Sig  . ALPRAZolam (XANAX) 0.25 MG tablet Take 1 tablet (0.25 mg total) by mouth at bedtime.  . Ascorbic Acid (VITAMIN C) 1000 MG tablet Take 1,000 mg by mouth 2 (two) times daily.  . cetirizine (ZYRTEC) 10 MG tablet Take 10 mg by mouth daily.  . citalopram (CELEXA) 40 MG tablet Take 1 tablet (40 mg total) by mouth daily.  . clotrimazole (LOTRIMIN) 1 % cream Apply 1 application topically daily as needed.  . furosemide (LASIX) 20 MG tablet Take 20 mg by mouth as needed.   . gabapentin (NEURONTIN) 300 MG capsule Take 1 capsule (300 mg total) by mouth daily as needed.  . Insulin Glargine (LANTUS SOLOSTAR) 100 UNIT/ML Solostar Pen Inject 30 Units into the skin daily at 10 pm.  . levothyroxine (SYNTHROID, LEVOTHROID) 112 MCG tablet Take 1 tablet (112 mcg total) by mouth daily before breakfast.  . ONETOUCH VERIO test strip Check blood sugar daily.  . simvastatin (ZOCOR) 40 MG tablet Take 1 tablet (40 mg total) by mouth daily.  . sitaGLIPtin (JANUVIA) 50 MG tablet Take 1 tablet (50 mg total) by mouth daily.  . traZODone (DESYREL) 50 MG tablet Take 0.5 tablets (25 mg total) by mouth at bedtime as needed for sleep.  Marland Kitchen. triamcinolone lotion (KENALOG) 0.1 % Apply 1 application topically daily. To scalp and rash  . [DISCONTINUED] ALPRAZolam (XANAX) 0.25 MG tablet Take 1 tablet (0.25 mg total) by mouth at bedtime.  . [DISCONTINUED] triamcinolone lotion (KENALOG) 0.1 % Apply 1 application topically daily.  . busPIRone (BUSPAR) 10 MG tablet Take 1 tablet (10 mg total) by mouth 3 (three) times daily. For anxiety   No facility-administered encounter medications on file as of 06/14/2018.     Review of Systems:  Review of Systems  Skin:  Positive for rash.  Psychiatric/Behavioral: Positive for sleep disturbance. The patient is nervous/anxious.   All other systems reviewed and are negative.   Health Maintenance  Topic Date Due  . Hepatitis C Screening  1946/11/08  . DEXA SCAN  03/29/2011  . OPHTHALMOLOGY EXAM  10/17/2013  . PNA vac Low Risk Adult (2 of 2 - PCV13) 08/10/2014  . INFLUENZA VACCINE  07/07/2018  . HEMOGLOBIN A1C  12/11/2018  . FOOT EXAM  03/09/2019  . URINE MICROALBUMIN  03/09/2019  . MAMMOGRAM  07/13/2019  . TETANUS/TDAP  12/09/2020  . COLONOSCOPY  03/11/2021    Physical Exam: Vitals:   06/14/18 1157  BP: 130/68  Pulse: 72  Resp: 18  Temp: 98.1 F (36.7 C)  TempSrc: Oral  SpO2: 92%  Weight: 221 lb (100.2  kg)  Height: 5\' 4"  (1.626 m)   Body mass index is 37.93 kg/m. Physical Exam  Constitutional: She appears well-developed and well-nourished.  Neurological: She is alert. Gait normal.  Skin: Skin is warm and dry. Rash (red rash along beltline with no vesicular formation/burrows) noted.  Psychiatric: Her behavior is normal. Judgment and thought content normal. Her mood appears anxious.    Labs reviewed: Basic Metabolic Panel: Recent Labs    03/08/18 1006 03/15/18 1339 06/10/18 1140  NA 149* 141 141  K 5.0 4.9 4.6  CL 110 105 109  CO2 29 26 22   GLUCOSE 64* 94 103*  BUN 25 29* 24  CREATININE 1.48* 1.75* 1.39*  CALCIUM 9.1 9.4 9.2  TSH 0.57  --  0.15*   Liver Function Tests: Recent Labs    03/08/18 1006 06/10/18 1140  AST 15 13  ALT 14 12  BILITOT 0.4 0.5  PROT 6.4 5.9*   No results for input(s): LIPASE, AMYLASE in the last 8760 hours. No results for input(s): AMMONIA in the last 8760 hours. CBC: Recent Labs    03/08/18 1006  WBC 7.2  NEUTROABS 4,507  HGB 12.5  HCT 38.3  MCV 85.3  PLT 313   Lipid Panel: Recent Labs    03/08/18 1006 06/10/18 1140  CHOL 170 190  HDL 44* 41*  LDLCALC 105* 161*  TRIG 116 152*  CHOLHDL 3.9 4.6   Lab Results  Component  Value Date   HGBA1C 5.7 (H) 06/10/2018    Procedures since last visit: No results found.  Assessment/Plan   ICD-10-CM   1. GAD (generalized anxiety disorder) - failing to change as expected F41.1 ALPRAZolam (XANAX) 0.25 MG tablet    busPIRone (BUSPAR) 10 MG tablet  2. Contact dermatitis, unspecified contact dermatitis type, unspecified trigger - possibly psoriasis L25.9 triamcinolone lotion (KENALOG) 0.1 %  3. Insomnia due to medical condition G47.01   4. Recurrent major depressive disorder, in full remission (HCC) - stable F33.42   5. Type 2 diabetes mellitus with complication, with long-term current use of insulin (HCC) E11.8    Z79.4   6. Scalp psoriasis - stable L40.9      Discussed goal BS <120. Ok to reduce insulin 30 units to prevent low BS reactions  START BUSPAR 10MG  3 TIMES DAILY FOR ANXIETY  TAKE INSULIN 30 UNITS DAILY AS ORDERED  Continue other medications as ordered  Continue diet and exercise program  Follow up in 1 month for anxiety, DM or sooner if need be  [place] S. Ancil Linsey  [facility] [address] [place], [place] [address] [phone] Cell (Monday-Friday 8 AM - 5 PM) [phone] After 5 PM and follow prompts

## 2018-06-21 ENCOUNTER — Encounter: Payer: Self-pay | Admitting: Cardiovascular Disease

## 2018-06-21 ENCOUNTER — Ambulatory Visit (INDEPENDENT_AMBULATORY_CARE_PROVIDER_SITE_OTHER): Payer: Medicare Other | Admitting: Cardiovascular Disease

## 2018-06-21 DIAGNOSIS — R011 Cardiac murmur, unspecified: Secondary | ICD-10-CM | POA: Diagnosis not present

## 2018-06-21 NOTE — Progress Notes (Signed)
06/21/2018 Jody DoppKelcia Rae Tay   [birth year]  [id number]  Primary Physician Kirt Boysarter, Monica, DO Primary Cardiologist: Runell GessJonathan J Artesia Berkey MD Milagros LollFACP, FACC, [place], [place]  HPI:  Jody [name] is a 72 y.o. moderately overweight divorced Caucasian female mother of 2 children, grandmother one grandchild who is accompanied by her daughter Leotis ShamesLauren today.  She was referred by Kirt BoysMonica Carter, DO for cardiovascular evaluation because of an auscultated murmur.  Risk factors include treated hyperlipidemia and diabetes.  She is a retired Tree surgeonbeautician.  She is never had a heart attack or stroke and denies chest pain or shortness of breath.  Apparently a murmur was auscultated and a 2D echo performed 05/23/2018 that showed aortic sclerosis without stenosis.   Current Meds  Medication Sig  . ALPRAZolam (XANAX) 0.25 MG tablet Take 1 tablet (0.25 mg total) by mouth at bedtime.  . Ascorbic Acid (VITAMIN C) 1000 MG tablet Take 1,000 mg by mouth 2 (two) times daily.  . busPIRone (BUSPAR) 10 MG tablet Take 1 tablet (10 mg total) by mouth 3 (three) times daily. For anxiety  . cetirizine (ZYRTEC) 10 MG tablet Take 10 mg by mouth daily.  . citalopram (CELEXA) 40 MG tablet Take 1 tablet (40 mg total) by mouth daily.  . clotrimazole (LOTRIMIN) 1 % cream Apply 1 application topically daily as needed.  . furosemide (LASIX) 20 MG tablet Take 20 mg by mouth as needed.   . gabapentin (NEURONTIN) 300 MG capsule Take 1 capsule (300 mg total) by mouth daily as needed.  . Insulin Glargine (LANTUS SOLOSTAR) 100 UNIT/ML Solostar Pen Inject 30 Units into the skin daily at 10 pm.  . levothyroxine (SYNTHROID, LEVOTHROID) 112 MCG tablet Take 1 tablet (112 mcg total) by mouth daily before breakfast.  . ONETOUCH VERIO test strip Check blood sugar daily.  . simvastatin (ZOCOR) 40 MG tablet Take 1 tablet (40 mg total) by mouth daily.  . sitaGLIPtin (JANUVIA) 50 MG tablet Take 1 tablet (50 mg total) by mouth daily.  . traZODone (DESYREL) 50  MG tablet Take 0.5 tablets (25 mg total) by mouth at bedtime as needed for sleep.  Marland Kitchen. triamcinolone lotion (KENALOG) 0.1 % Apply 1 application topically daily. To scalp and rash     Allergies  Allergen Reactions  . Metformin And Related Diarrhea  . Ultram [Tramadol] Other (See Comments)    falls  . Penicillins Rash    Social History   Socioeconomic History  . Marital status: Divorced    Spouse name: Not on file  . Number of children: Not on file  . Years of education: Not on file  . Highest education level: Not on file  Occupational History  . Not on file  Social Needs  . Financial resource strain: Not on file  . Food insecurity:    Worry: Not on file    Inability: Not on file  . Transportation needs:    Medical: Not on file    Non-medical: Not on file  Tobacco Use  . Smoking status: Never Smoker  . Smokeless tobacco: Never Used  Substance and Sexual Activity  . Alcohol use: No    Frequency: Never  . Drug use: No  . Sexual activity: Not Currently  Lifestyle  . Physical activity:    Days per week: Not on file    Minutes per session: Not on file  . Stress: Not on file  Relationships  . Social connections:    Talks on phone: Not on file  Gets together: Not on file    Attends religious service: Not on file    Active member of club or organization: Not on file    Attends meetings of clubs or organizations: Not on file    Relationship status: Not on file  . Intimate partner violence:    Fear of current or ex partner: Not on file    Emotionally abused: Not on file    Physically abused: Not on file    Forced sexual activity: Not on file  Other Topics Concern  . Not on file  Social History Narrative   Social History      Diet?      Do you drink/eat things with caffeine? Coffee, soda      Marital status?            divorced                        What year were you married? 1969      Do you live in a house, apartment, assisted living, condo, trailer, etc.?  house      Is it one or more stories? 2 story      How many persons live in your home? 2      Do you have any pets in your home? (please list) 1 dog, 3 cats      Highest level of education completed? Beautician school      Current or past profession: Tree surgeon, bus driver (school)      Do you exercise?    no                                  Type & how often?      Advanced Directives      Do you have a living will? no      Do you have a DNR form?                                  If not, do you want to discuss one? no      Do you have signed POA/HPOA for forms?  yes      Functional Status      Do you have difficulty bathing or dressing yourself? no      Do you have difficulty preparing food or eating? no      Do you have difficulty managing your medications? no      Do you have difficulty managing your finances? no      Do you have difficulty affording your medications? no     Review of Systems: General: negative for chills, fever, night sweats or weight changes.  Cardiovascular: negative for chest pain, dyspnea on exertion, edema, orthopnea, palpitations, paroxysmal nocturnal dyspnea or shortness of breath Dermatological: negative for rash Respiratory: negative for cough or wheezing Urologic: negative for hematuria Abdominal: negative for nausea, vomiting, diarrhea, bright red blood per rectum, melena, or hematemesis Neurologic: negative for visual changes, syncope, or dizziness All other systems reviewed and are otherwise negative except as noted above.    Blood pressure 116/66, pulse 73, height 5\' 4"  (1.626 m), weight 221 lb (100.2 kg).  General appearance: alert and no distress Neck: no adenopathy, no carotid bruit, no JVD, supple, symmetrical, trachea midline and thyroid not enlarged, symmetric, no tenderness/mass/nodules Lungs: clear  to auscultation bilaterally Heart: regular rate and rhythm, S1, S2 normal, no murmur, click, rub or gallop Extremities:  extremities normal, atraumatic, no cyanosis or edema Pulses: 2+ and symmetric Skin: Skin color, texture, turgor normal. No rashes or lesions Neurologic: Alert and oriented X 3, normal strength and tone. Normal symmetric reflexes. Normal coordination and gait  EKG sinus rhythm at 73 without ST or T wave changes.  I personally reviewed this EKG.  ASSESSMENT AND PLAN:   Mixed hyperlipidemia History of hyperlipidemia on statin therapy.  Cardiac murmur History of cardiac murmur with recent 2D echo performed 05/23/2018 revealing normal LV systolic function with aortic sclerosis without stenosis.      Runell Gess MD FACP,FACC,FAHA, [facility] 06/21/2018 11:46 AM

## 2018-06-21 NOTE — Patient Instructions (Signed)
Medication Instructions:  Your physician recommends that you continue on your current medications as directed. Please refer to the Current Medication list given to you today.   Labwork: none  Testing/Procedures: none  Follow-Up: Follow up with Dr. Berry as needed.   Any Other Special Instructions Will Be Listed Below (If Applicable).     If you need a refill on your cardiac medications before your next appointment, please call your pharmacy.   

## 2018-06-21 NOTE — Assessment & Plan Note (Signed)
History of hyperlipidemia on statin therapy. 

## 2018-06-21 NOTE — Assessment & Plan Note (Signed)
History of cardiac murmur with recent 2D echo performed 05/23/2018 revealing normal LV systolic function with aortic sclerosis without stenosis.

## 2018-07-06 ENCOUNTER — Other Ambulatory Visit: Payer: Self-pay | Admitting: Internal Medicine

## 2018-07-06 DIAGNOSIS — F411 Generalized anxiety disorder: Secondary | ICD-10-CM

## 2018-07-15 ENCOUNTER — Encounter: Payer: Self-pay | Admitting: Internal Medicine

## 2018-07-15 ENCOUNTER — Ambulatory Visit (INDEPENDENT_AMBULATORY_CARE_PROVIDER_SITE_OTHER): Payer: Medicare Other | Admitting: Internal Medicine

## 2018-07-15 VITALS — BP 120/72 | HR 68 | Temp 98.2°F | Ht 64.0 in | Wt 219.0 lb

## 2018-07-15 DIAGNOSIS — E118 Type 2 diabetes mellitus with unspecified complications: Secondary | ICD-10-CM

## 2018-07-15 DIAGNOSIS — Z23 Encounter for immunization: Secondary | ICD-10-CM | POA: Diagnosis not present

## 2018-07-15 DIAGNOSIS — E032 Hypothyroidism due to medicaments and other exogenous substances: Secondary | ICD-10-CM | POA: Diagnosis not present

## 2018-07-15 DIAGNOSIS — M5442 Lumbago with sciatica, left side: Secondary | ICD-10-CM | POA: Diagnosis not present

## 2018-07-15 DIAGNOSIS — F411 Generalized anxiety disorder: Secondary | ICD-10-CM | POA: Diagnosis not present

## 2018-07-15 DIAGNOSIS — Z79899 Other long term (current) drug therapy: Secondary | ICD-10-CM

## 2018-07-15 DIAGNOSIS — M5441 Lumbago with sciatica, right side: Secondary | ICD-10-CM

## 2018-07-15 DIAGNOSIS — Z794 Long term (current) use of insulin: Secondary | ICD-10-CM

## 2018-07-15 MED ORDER — SITAGLIPTIN PHOSPHATE 50 MG PO TABS: 50.0000 mg | ORAL_TABLET | Freq: Every day | ORAL | 1 refills | Status: DC

## 2018-07-15 MED ORDER — BUSPIRONE HCL 15 MG PO TABS: 15.0000 mg | ORAL_TABLET | Freq: Three times a day (TID) | ORAL | 3 refills | Status: DC

## 2018-07-15 MED ORDER — ALPRAZOLAM 0.25 MG PO TABS: 0.2500 mg | ORAL_TABLET | Freq: Every day | ORAL | 0 refills | Status: DC

## 2018-07-15 NOTE — Progress Notes (Signed)
Patient ID: Jody [name], female   DOB: 06/09/1946, 72 y.o.   MRN: [id number]   Location:  [facility] OFFICE  Provider: DR  S   Code Status:  Goals of Care: No flowsheet data found.   Chief Complaint  Patient presents with  . Follow-up    Pt is being seen for a 1 month follow up  . Medication Refill    Rx pended for alprazolam and januvia.  database checked.   . Audit C Screening    score of 0    HPI: Patient is a 72 y.o. female seen today for f/u GAD and DM.  She fell about 3 weeks ago while getting into truck. No head injury. She landed on her left knee and left hip. She did not seek medical attention. Since fall, she noticed increased stiffness in left knee that resolves with ambulation.   GAD - initially better on buspar but now no better and she is back to grinding her teeth  DM - improved.  Weight down 2 lbs since last month (total 21 lbs since Apr 2019).   Bipolar d/o, manic, moderate. MDD in full remission - she reports misdiagnosed and she no longer takes lithium. She was taking celexa, abilify, and trazodone. She took alprazolam to help her sleep. GDR initiated 03/08/18 and she is now off abilify but had to resume alprazolam and increase trazodone to 50mg. She was seeing psych prior to relocation from [place]  Past Medical History:  Diagnosis Date  . Abnormal involuntary movement   . Acute cystitis   . Arthralgia of pelvic region and thigh   . Bilateral nonexudative age-related macular degeneration   . Bipolar disorder (HCC)   . Claustrophobia   . Combined form of senile cataract   . Coronary arteriosclerosis   . Decreased hearing   . Depression   . Diabetes (HCC)   . Displacement of lumbar intervertebral disc without myelopathy   . Dysuria   . Glaucoma   . Glycosuria   . Gout   . History of mania   . Hypercalcemia   . Hyperglycemia   . Hypothyroidism   . Kidney disease   . Macular degeneration   . Nocturia   . Nonalcoholic fatty liver disease  without nonalcoholic steatohepatitis (NASH)   . Osteoarthritis    pelvic region and thigh  . Palpitations   . Plantar fasciitis   . Psoriasis   . Solitary pulmonary nodule present on computed tomography of lung   . Speaking difficulty   . Spinal stenosis of lumbar region   . Stage 3 chronic renal impairment associated with type 2 diabetes mellitus (HCC)   . Thoracic and lumbosacral neuritis   . Tremor   . Vitreous degeneration     Past Surgical History:  Procedure Laterality Date  . APPENDECTOMY    . BACK SURGERY     rods placed  . carpel tunnel     right in 1996, left in 1994  . CATARACT EXTRACTION    . HYSTEROSCOPY  04/18/2013  . LAMINECTOMY  11/14/2009   L4-5 Lamfusion, [place] and repair dural tear  . NECK SURGERY     4-5 disc  . TOTAL HIP ARTHROPLASTY Right      reports that she has never smoked. She has never used smokeless tobacco. She reports that she does not drink alcohol or use drugs. Social History   Socioeconomic History  . Marital status: Divorced    Spouse name: Not on file  .   Number of children: Not on file  . Years of education: Not on file  . Highest education level: Not on file  Occupational History  . Not on file  Social Needs  . Financial resource strain: Not on file  . Food insecurity:    Worry: Not on file    Inability: Not on file  . Transportation needs:    Medical: Not on file    Non-medical: Not on file  Tobacco Use  . Smoking status: Never Smoker  . Smokeless tobacco: Never Used  Substance and Sexual Activity  . Alcohol use: No    Frequency: Never  . Drug use: No  . Sexual activity: Not Currently  Lifestyle  . Physical activity:    Days per week: Not on file    Minutes per session: Not on file  . Stress: Not on file  Relationships  . Social connections:    Talks on phone: Not on file    Gets together: Not on file    Attends religious service: Not on file    Active member of club or organization: Not on file    Attends  meetings of clubs or organizations: Not on file    Relationship status: Not on file  . Intimate partner violence:    Fear of current or ex partner: Not on file    Emotionally abused: Not on file    Physically abused: Not on file    Forced sexual activity: Not on file  Other Topics Concern  . Not on file  Social History Narrative   Social History      Diet?      Do you drink/eat things with caffeine? Coffee, soda      Marital status?            divorced                        What year were you married? 1969      Do you live in a house, apartment, assisted living, condo, trailer, etc.? house      Is it one or more stories? 2 story      How many persons live in your home? 2      Do you have any pets in your home? (please list) 1 dog, 3 cats      Highest level of education completed? Beautician school      Current or past profession: Insurance claims handler, bus driver (school)      Do you exercise?    no                                  Type & how often?      Advanced Directives      Do you have a living will? no      Do you have a DNR form?                                  If not, do you want to discuss one? no      Do you have signed POA/HPOA for forms?  yes      Functional Status      Do you have difficulty bathing or dressing yourself? no      Do you have difficulty preparing food or eating? no  Do you have difficulty managing your medications? no      Do you have difficulty managing your finances? no      Do you have difficulty affording your medications? no    Family History  Problem Relation Age of Onset  . Peripheral Artery Disease Mother   . Cancer Father     Allergies  Allergen Reactions  . Metformin And Related Diarrhea  . Ultram [Tramadol] Other (See Comments)    falls  . Penicillins Rash    Outpatient Encounter Medications as of 07/15/2018  Medication Sig  . ALPRAZolam (XANAX) 0.25 MG tablet Take 1 tablet (0.25 mg total) by mouth at bedtime.  .  busPIRone (BUSPAR) 10 MG tablet TAKE 1 TABLET (10 MG TOTAL) BY MOUTH 3 (THREE) TIMES DAILY. FOR ANXIETY  . cetirizine (ZYRTEC) 10 MG tablet Take 10 mg by mouth daily.  . citalopram (CELEXA) 40 MG tablet Take 1 tablet (40 mg total) by mouth daily.  . clotrimazole (LOTRIMIN) 1 % cream Apply 1 application topically daily as needed.  . furosemide (LASIX) 20 MG tablet Take 20 mg by mouth as needed.   . gabapentin (NEURONTIN) 300 MG capsule Take 1 capsule (300 mg total) by mouth daily as needed.  . Insulin Glargine (LANTUS SOLOSTAR) 100 UNIT/ML Solostar Pen Inject 30 Units into the skin daily at 10 pm.  . levothyroxine (SYNTHROID, LEVOTHROID) 112 MCG tablet Take 1 tablet (112 mcg total) by mouth daily before breakfast.  . ONETOUCH VERIO test strip Check blood sugar daily.  . simvastatin (ZOCOR) 40 MG tablet Take 1 tablet (40 mg total) by mouth daily.  . sitaGLIPtin (JANUVIA) 50 MG tablet Take 1 tablet (50 mg total) by mouth daily.  . traZODone (DESYREL) 50 MG tablet Take 0.5 tablets (25 mg total) by mouth at bedtime as needed for sleep. (Patient taking differently: Take 50 mg by mouth at bedtime as needed for sleep. )  . triamcinolone lotion (KENALOG) 0.1 % Apply 1 application topically daily. To scalp and rash  . [DISCONTINUED] Ascorbic Acid (VITAMIN C) 1000 MG tablet Take 1,000 mg by mouth 2 (two) times daily.   No facility-administered encounter medications on file as of 07/15/2018.     Review of Systems:  Review of Systems  Musculoskeletal: Positive for arthralgias, gait problem and joint swelling.  All other systems reviewed and are negative.   Health Maintenance  Topic Date Due  . DEXA SCAN  03/29/2011  . OPHTHALMOLOGY EXAM  10/17/2013  . PNA vac Low Risk Adult (2 of 2 - PCV13) 08/10/2014  . INFLUENZA VACCINE  07/07/2018  . Hepatitis C Screening  07/16/2019 (Originally 1946-09-08)  . HEMOGLOBIN A1C  12/11/2018  . FOOT EXAM  03/09/2019  . URINE MICROALBUMIN  03/09/2019  . MAMMOGRAM   07/13/2019  . TETANUS/TDAP  12/09/2020  . COLONOSCOPY  03/11/2021    Physical Exam: Vitals:   07/15/18 1102  BP: 120/72  Pulse: 68  Temp: 98.2 F (36.8 C)  TempSrc: Oral  SpO2: 96%  Weight: 219 lb (99.3 kg)  Height: 5' 4" (1.626 m)   Body mass index is 37.59 kg/m. Physical Exam  Constitutional: She is oriented to person, place, and time. She appears well-developed and well-nourished.    Cardiovascular: Normal rate, regular rhythm and intact distal pulses. Exam reveals no gallop and no friction rub.  Murmur (1/6 SEM) heard. No LE edema b/l; no calf TTP  Pulmonary/Chest: Effort normal and breath sounds normal. No stridor. No respiratory distress. She has no wheezes.  She has no rales.  Musculoskeletal: She exhibits edema (left knee) and tenderness.       Lumbar back: She exhibits decreased range of motion, tenderness and spasm.       Back:  Neurological: She is alert and oriented to person, place, and time.  Skin: Skin is warm and dry. No rash noted.  Lateral left knee abrasion scar  Psychiatric: She has a normal mood and affect. Her behavior is normal. Thought content normal.    Labs reviewed: Basic Metabolic Panel: Recent Labs    03/08/18 1006 03/15/18 1339 06/10/18 1140  NA 149* 141 141  K 5.0 4.9 4.6  CL 110 105 109  CO2 _0 GLUCOSE 64* 94 103*  BUN 25 29* 24  CREATININE 1.48* 1.75* 1.39*  CALCIUM 9.1 9.4 9.2  TSH 0.57  --  0.15*   Liver Function Tests: Recent Labs    03/08/18 1006 06/10/18 1140  AST 15 13  ALT 14 12  BILITOT 0.4 0.5  PROT 6.4 5.9*   No results for input(s): LIPASE, AMYLASE in the last 8760 hours. No results for input(s): AMMONIA in the last 8760 hours. CBC: Recent Labs    03/08/18 1006  WBC 7.2  NEUTROABS 4,507  HGB 12.5  HCT 38.3  MCV 85.3  PLT 313   Lipid Panel: Recent Labs    03/08/18 1006 06/10/18 1140  CHOL 170 190  HDL 44* 41*  LDLCALC 105* 122*  TRIG 116 152*  CHOLHDL 3.9 4.6   Lab Results    Component Value Date   HGBA1C 5.7 (H) 06/10/2018    Procedures since last visit: No results found.  Assessment/Plan   ICD-10-CM   1. Acute left-sided low back pain with bilateral sciatica M54.42 Ambulatory referral to Physical Therapy   M54.41   2. GAD (generalized anxiety disorder) F41.1 ALPRAZolam (XANAX) 0.25 MG tablet    busPIRone (BUSPAR) 15 MG tablet  3. Type 2 diabetes mellitus with complication, with long-term current use of insulin (HCC) E11.8 sitaGLIPtin (JANUVIA) 50 MG tablet   Z79.4 Insulin Glargine (LANTUS SOLOSTAR) 100 UNIT/ML Solostar Pen    Lipid Panel    Hemoglobin A1c  4. Hypothyroidism due to medication E03.2 TSH  5. High risk medication use Z79.899 CMP with eGFR([facility])  6. Need for pneumococcal vaccination Z23 Pneumococcal conjugate vaccine 13-valent    Will call with PT referral  REDUCE LANTUS 25 UNITS AT BEDTIME  INCREASE BUSPAR 15MG 3 TIMES DAILY  Continue other medications as ordered  CALL OFFICE IN 1 MONTH WITH BLOOD SUGAR READINGS TO DETERMINE IF CAN CONTINUE TO TAPER OFF INSULIN  Follow up with Janett Billow in 2 mos for anxiety, DM. Fasting labs prior to appt  [place] S. Perlie Gold  [facility] [address] [place], [place] [address] [phone] Cell (Monday-Friday 8 AM - 5 PM) [phone] After 5 PM and follow prompts

## 2018-07-15 NOTE — Patient Instructions (Addendum)
Will call with PT referral  REDUCE LANTUS 25 UNITS AT BEDTIME  INCREASE BUSPAR 15MG  3 TIMES DAILY  Continue other medications as ordered  CALL OFFICE IN 1 MONTH WITH BLOOD SUGAR READINGS TO DETERMINE IF CAN CONTINUE TO TAPER OFF INSULIN  Follow up with Shanda BumpsJessica in 2 mos for anxiety, DM. Fasting labs prior to appt

## 2018-07-27 ENCOUNTER — Encounter: Payer: Self-pay | Admitting: Internal Medicine

## 2018-08-09 ENCOUNTER — Telehealth: Payer: Self-pay | Admitting: *Deleted

## 2018-08-09 NOTE — Telephone Encounter (Signed)
Patient called and stated that she does not feel that the Buspar she is taking is helping. Stated that she feels like she is worse. Stated that she is clicking her teeth during the day and grinding them at night.   2. Also stated that she would like to have ONE pill to take for sleep instead of multiple ones.   Please Advise.

## 2018-08-09 NOTE — Telephone Encounter (Signed)
She is only taking trazodone to help her sleep; alprazolam for her nerves; recommend she be referred to psych for further management of her mood meds; otherwise we will need to resume abilify as I do not believe she was grinding her teeth while on that med

## 2018-08-10 ENCOUNTER — Other Ambulatory Visit: Payer: Self-pay

## 2018-08-10 ENCOUNTER — Ambulatory Visit: Payer: Medicare Other | Attending: Internal Medicine

## 2018-08-10 DIAGNOSIS — M5442 Lumbago with sciatica, left side: Secondary | ICD-10-CM

## 2018-08-10 DIAGNOSIS — M79605 Pain in left leg: Secondary | ICD-10-CM

## 2018-08-10 DIAGNOSIS — M6281 Muscle weakness (generalized): Secondary | ICD-10-CM | POA: Diagnosis present

## 2018-08-10 MED ORDER — ARIPIPRAZOLE 2 MG PO TABS: 2.0000 mg | ORAL_TABLET | Freq: Every day | ORAL | 3 refills | Status: DC

## 2018-08-10 NOTE — Patient Instructions (Signed)
Access Code: [email or web address]  URL: [email or web address]  Date: 08/10/2018  Prepared by: Olga Coaster   Exercises  Supine Figure 4 Piriformis Stretch - 10 reps - 3 sets - 1x daily - 7x weekly  Supine Lower Trunk Rotation - 10 reps - 1 sets - 3 hold - 1x daily - 7x weekly  Supine Hip Adduction Isometric with Ball - 10 reps - 1 sets - 1x daily - 7x weekly

## 2018-08-10 NOTE — Telephone Encounter (Signed)
RX for Abilify sent to pharmacy  I called patient to inform her and she indicated that she changed her mind about Abilify for it can cause weight gain and she is finally losing weight. I reiterated Dr.Carter's recommendation to see psych. Patient states she will get established with psych but in the meantime what else can Dr.Carter do.   Patient aware Dr.Carter is out of office and I will call her tomorrow with response   * I called the pharmacy to cancel rx sent in for Abilify  Please advise

## 2018-08-10 NOTE — Telephone Encounter (Signed)
Yes resume 2mg  daily

## 2018-08-10 NOTE — Therapy (Addendum)
[facility] [address] [place], [address] Phone: [phone]   Fax:  [phone]  Physical Therapy Evaluation  Patient Details  Name: Jody [name] MRN: [id number] Date of Birth: 10/12/1946 Referring Provider: Kirt Boys   Encounter Date: 08/10/2018  PT End of Session - 08/10/18 1827    Visit Number  1    Number of Visits  12    Date for PT Re-Evaluation  09/21/18    Authorization Type  [employer]    PT Start Time  1732    PT Stop Time  1826    PT Time Calculation (min)  54 min    Activity Tolerance  Patient tolerated treatment well    Behavior During Therapy  [facility] for tasks assessed/performed       Past Medical History:  Diagnosis Date  . Abnormal involuntary movement   . Acute cystitis   . Arthralgia of pelvic region and thigh   . Bilateral nonexudative age-related macular degeneration   . Bipolar disorder (HCC)   . Claustrophobia   . Combined form of senile cataract   . Coronary arteriosclerosis   . Decreased hearing   . Depression   . Diabetes (HCC)   . Displacement of lumbar intervertebral disc without myelopathy   . Dysuria   . Glaucoma   . Glycosuria   . Gout   . History of mania   . Hypercalcemia   . Hyperglycemia   . Hypothyroidism   . Kidney disease   . Macular degeneration   . Nocturia   . 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 Osteoarthritis    pelvic region and thigh  . Palpitations   . Plantar fasciitis   . Psoriasis   . Solitary pulmonary nodule present on computed tomography of lung   . Speaking difficulty   . Spinal stenosis of lumbar region   . Stage 3 chronic renal impairment associated with type 2 diabetes mellitus (HCC)   . Thoracic and lumbosacral neuritis   . Tremor   . Vitreous degeneration     Past Surgical History:  Procedure Laterality Date  . APPENDECTOMY    . BACK SURGERY     rods placed  . carpel  tunnel     right in 1996, left in 1994  . CATARACT EXTRACTION    . HYSTEROSCOPY  04/18/2013  . LAMINECTOMY  11/14/2009   L4-5 Lamfusion, [place] and repair dural tear  . NECK SURGERY     4-5 disc  . TOTAL HIP ARTHROPLASTY Right     There were no vitals filed for this visit.   Subjective Assessment - 08/10/18 1758    Subjective  patient reports pain at start of session is 0. Worst pain is in the middle night when she gets up to use the bathroom.    Patient is accompained by:  Family member    Pertinent History  Patient is 72 yo female that complains of L leg pain s/p fall 1 month ago. States she was trying to get into a car while holding a cup, and fell out of the car, landed on her L side. Family reports she fell really hard and very fast. Reports L knee took the brunt of the fall. States her pain started that day but not enough to go the hospital. Pain is better since initial fall, but is still having intermittent pain, especially when she gets up. Most difficulty with sitting for  long period of time. PMH of lumbar fusion, cervical disectomy, R THA, chronic kidney disease.    Limitations  Sitting;Standing;Walking;Reading;House hold activities    How long can you sit comfortably?  25-30 mins    Patient Stated Goals  pain relief    Currently in Pain?  No/denies    Pain Score  0-No pain   best: 0 worst: 9 (getting up in the middle of the night   Pain Location  Leg    Pain Orientation  Left    Pain Descriptors / Indicators  Shooting;Tingling;Numbness;Sharp   shocks   Pain Type  Acute pain    Pain Radiating Towards  L foot, usually above the knee    Pain Onset  More than a month ago    Pain Frequency  Intermittent    Aggravating Factors   sitting    Pain Relieving Factors  massage    Effect of Pain on Daily Activities  impedes daily activities         08/10/18 0001  Assessment  Medical Diagnosis acute left-sided low back pain with bilateral sciatica  Referring Provider Kirt Boys  Onset Date/Surgical Date 07/09/18  Hand Dominance Right  Prior Therapy yes  Precautions  Precautions None  Restrictions  Weight Bearing Restrictions No  Balance Screen  Has the patient fallen in the past 6 months Yes  How many times? 1  Has the patient had a decrease in activity level because of a fear of falling?  Yes  Is the patient reluctant to leave their home because of a fear of falling?  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  Living Arrangements Children;Other relatives  Available Help at Discharge Family  Type of Home House  Home Access Level entry  Home Layout Two level  Alternate Level Stairs-Number of Steps 15  Alternate Level Stairs-Rails Left  Home Equipment Wheelchair - manual;Cane - quad;Grab bars - tub/shower  Prior Function  Level of Independence Independent  Vocation Retired  IT consultant  Overall Cognitive Status Within Functional Limits for tasks assessed  Sensation  Light Touch Appears Intact  Posture/Postural Control  Posture Comments forward head rounded shoulders, decreased lumbar lordosis  ROM / Strength  AROM / PROM / Strength AROM;Strength  AROM  Overall AROM  Deficits  AROM Assessment Site Lumbar  Lumbar Flexion 60  Lumbar Extension 50  Lumbar - Right Side Bend 70  Lumbar - Left Side Bend 70  Lumbar - Right Rotation 80  Lumbar - Left Rotation 80  Strength  Overall Strength Deficits;Due to pain  Strength Assessment Site Hip;Knee;Ankle  Right/Left Hip Right;Left  Right/Left Knee Left;Right  Right/Left Ankle Right;Left  Right Hip Flexion 4+/5  Right Hip ABduction 4+/5  Right Hip ADduction 4+/5  Left Hip Flexion 4-/5 (pain)  Left Hip ABduction 4-/5 (pain)  Left Hip ADduction 4/5  Right Knee Flexion 4+/5  Right Knee Extension 4+/5  Left Knee Flexion 4/5  Left Knee Extension 4+/5  Right Ankle Dorsiflexion 4+/5  Right Ankle Plantar Flexion 4+/5  Left Ankle Dorsiflexion 4+/5  Left Ankle Plantar Flexion 4+/5   Flexibility  Soft Tissue Assessment /Muscle Length y  Hamstrings R mod L mild  Piriformis L mod R mild  Palpation  Palpation comment TTP of posterior hip muscles, hypertonic lumbar paraspinals  Special Tests  Other special tests -slump - SLR on L  Ambulation/Gait  Gait Comments decreased speed, wide BOS, decreased stride length b/l    Therapeutic exercise: Patient performed with instruction, verbal  cues, tactile cues of therapist: goal:  Supine Figure 4 Piriformis Stretch - 2x20sec holds with verbal/tactile cues Supine Lower Trunk Rotation - 10 reps with 3 sec holdsb/l with tactile cues Supine Hip Adduction Isometric with pillow x15 with verbal cues  Patient response to treatment: patient demonstrated improved technique with exercises with repeated demonstration and VC.  Objective measurements completed on examination: See above findings.       PT Education - 08/10/18 1827    Education Details  HEP, condition    Person(s) Educated  Patient    Methods  Explanation;Demonstration;Handout;Verbal cues    Comprehension  Verbalized understanding;Verbal cues required;Tactile cues required;Returned demonstration       PT Short Term Goals - 08/11/18 0813      PT SHORT TERM GOAL #1   Title  Patient will be adherent to HEP at least 3x a week to improve functional strength and balance for better safety at home.    Time  3    Period  Weeks    Status  New        PT Long Term Goals - 08/11/18 1610      PT LONG TERM GOAL #1   Title  Patient will deny any falls over past 6 weeks to demonstrate improved safety awareness at home and work.    Time  6    Period  Weeks    Status  New    Target Date  09/22/18      PT LONG TERM GOAL #2   Title  Patient will be independent in finalized home exercise program to improve strength/mobility for better functional independence with ADLs.    Time  6    Period  Weeks    Status  New    Target Date  09/22/18      PT LONG TERM GOAL #3    Title  Patient will increase BLE gross strength to 4+/5 as to improve functional strength for independent gait, increased standing tolerance and increased ADL ability.    Time  6    Period  Weeks    Status  New    Target Date  09/22/18      PT LONG TERM GOAL #4   Title  Patient will be able to perform household work/chores, recreational activities, and sleep without increase in symptoms above 3/10.    Time  6    Period  Weeks    Status  New    Target Date  09/22/18             Plan - 08/10/18 1838    Clinical Impression Statement  Patient is 72 yo female that presents to clinic s/p fall 1 month ago with complaints of L leg pain. Upon assessment patient demonstrates limitations in lumbar and hip ROM, decreased LE strength and endurance, decreased activity tolerance, impaired soft tissue integrity of lumbar paraspinals and L posterior hip musculature, impaired gait and balance, and decreased ability to perform functional activities. The patient would benefit from further skilled PT to address these deficits and return patient to PLOF as well as decrease risk of falls.    History and Personal Factors relevant to plan of care:  lumbar fusion, R THA, chronic kidney disease, hx of falls, sedentary    Clinical Presentation  Stable    Clinical Presentation due to:  unchanging characteristics    Clinical Decision Making  Low    Rehab Potential  Good    Clinical Impairments Affecting Rehab Potential  poor endurance and strength, decreased activity tolerance    PT Frequency  2x / week    PT Duration  6 weeks    PT Treatment/Interventions  Aquatic Therapy;Electrical Stimulation;Cryotherapy;Ultrasound;Moist Heat;Gait training;Stair training;Functional mobility training;Therapeutic activities;Therapeutic exercise;Neuromuscular re-education;Balance training;Patient/family education;Manual techniques;Dry needling;Taping    PT Next Visit Plan  HS stretch, hip flexor stretch, STM, hip strengthening,  balance trainin    PT Home Exercise Plan  LTR, piriformis stretch, hip adduction     Consulted and Agree with Plan of Care  Patient       Patient will benefit from skilled therapeutic intervention in order to improve the following deficits and impairments:  Abnormal gait, Decreased activity tolerance, Decreased endurance, Decreased range of motion, Decreased strength, Improper body mechanics, Pain, Decreased balance, Decreased mobility, Difficulty walking, Impaired flexibility, Increased muscle spasms, Postural dysfunction  Visit Diagnosis: Acute left-sided low back pain with left-sided sciatica  Pain in left leg  Muscle weakness (generalized)     Problem List Patient Active Problem List   Diagnosis Date Noted  . Cardiac murmur 06/21/2018  . GAD (generalized anxiety disorder) 06/14/2018  . Recurrent major depressive disorder, in full remission (HCC) 03/08/2018  . Insomnia due to medical condition 03/08/2018  . Hypothyroidism due to medication 03/08/2018  . Type 2 diabetes mellitus with complication, with long-term current use of insulin (HCC) 03/08/2018  . Mixed hyperlipidemia 03/08/2018  . Bilateral lower extremity edema 03/08/2018  . Chronic kidney disease 03/08/2018  . Spinal stenosis of lumbar region with neurogenic claudication 03/08/2018    Olga Coaster PT, DPT 8:23 AM,08/11/18 [phone]  [facility] PHYSICAL AND SPORTS MEDICINE [address] [place], [address] Phone: [phone]   Fax:  [phone]  Name: Tomara Youngberg MRN: [id number] Date of Birth: 02-25-1946

## 2018-08-10 NOTE — Telephone Encounter (Signed)
Spoke with patient, patient verbalized understanding of Dr.Carter's response. Patient in agreement with switching back to Abilify.   Please advise if patient to restart Abilify 2 mg 1 by mouth daily

## 2018-08-11 NOTE — Telephone Encounter (Signed)
I have no other recommendations - she needs to follow up with psych; highly recommend you resume abilify

## 2018-08-11 NOTE — Telephone Encounter (Signed)
Left detailed message on voicemail for patient with Dr.Carter's response. I instructed patient to call back to advise on what she will do so that I can update her medication list

## 2018-08-12 ENCOUNTER — Other Ambulatory Visit: Payer: Self-pay | Admitting: Internal Medicine

## 2018-08-12 DIAGNOSIS — F411 Generalized anxiety disorder: Secondary | ICD-10-CM

## 2018-08-15 ENCOUNTER — Ambulatory Visit: Payer: Medicare Other

## 2018-08-17 ENCOUNTER — Ambulatory Visit: Payer: Medicare Other

## 2018-08-22 ENCOUNTER — Ambulatory Visit: Payer: Medicare Other

## 2018-08-24 ENCOUNTER — Ambulatory Visit: Payer: Medicare Other

## 2018-08-25 ENCOUNTER — Encounter: Payer: Self-pay | Admitting: Internal Medicine

## 2018-08-26 NOTE — Telephone Encounter (Signed)
Referral order pending, please complete

## 2018-08-30 ENCOUNTER — Other Ambulatory Visit: Payer: Self-pay | Admitting: Internal Medicine

## 2018-08-31 ENCOUNTER — Encounter: Payer: Self-pay | Admitting: Emergency Medicine

## 2018-08-31 ENCOUNTER — Other Ambulatory Visit: Payer: Self-pay

## 2018-08-31 ENCOUNTER — Emergency Department
Admission: EM | Admit: 2018-08-31 | Discharge: 2018-08-31 | Disposition: A | Discharge status: Home / Self Care | Payer: Medicare Other | Attending: Emergency Medicine | Admitting: Emergency Medicine

## 2018-08-31 DIAGNOSIS — N183 Chronic kidney disease, stage 3 (moderate): Secondary | ICD-10-CM | POA: Diagnosis not present

## 2018-08-31 DIAGNOSIS — F3341 Major depressive disorder, recurrent, in partial remission: Secondary | ICD-10-CM | POA: Diagnosis not present

## 2018-08-31 DIAGNOSIS — Z96641 Presence of right artificial hip joint: Secondary | ICD-10-CM | POA: Insufficient documentation

## 2018-08-31 DIAGNOSIS — E039 Hypothyroidism, unspecified: Secondary | ICD-10-CM | POA: Diagnosis not present

## 2018-08-31 DIAGNOSIS — F419 Anxiety disorder, unspecified: Secondary | ICD-10-CM | POA: Diagnosis present

## 2018-08-31 DIAGNOSIS — E1122 Type 2 diabetes mellitus with diabetic chronic kidney disease: Secondary | ICD-10-CM | POA: Insufficient documentation

## 2018-08-31 LAB — URINALYSIS, COMPLETE (UACMP) WITH MICROSCOPIC
Bacteria, UA: NONE SEEN
Bilirubin Urine: NEGATIVE
Glucose, UA: NEGATIVE mg/dL
Hgb urine dipstick: NEGATIVE
KETONES UR: NEGATIVE mg/dL
LEUKOCYTES UA: NEGATIVE
Nitrite: NEGATIVE
PH: 6 (ref 5.0–8.0)
PROTEIN: NEGATIVE mg/dL
Specific Gravity, Urine: 1.009 (ref 1.005–1.030)

## 2018-08-31 LAB — BASIC METABOLIC PANEL
ANION GAP: 9 (ref 5–15)
BUN: 25 mg/dL — ABNORMAL HIGH (ref 8–23)
CALCIUM: 9.2 mg/dL (ref 8.9–10.3)
CO2: 27 mmol/L (ref 22–32)
Chloride: 102 mmol/L (ref 98–111)
Creatinine, Ser: 1.54 mg/dL — ABNORMAL HIGH (ref 0.44–1.00)
GFR, EST AFRICAN AMERICAN: 38 mL/min — AB (ref >60)
GFR, EST NON AFRICAN AMERICAN: 33 mL/min — AB (ref >60)
Glucose, Bld: 159 mg/dL — ABNORMAL HIGH (ref 70–99)
POTASSIUM: 4.9 mmol/L (ref 3.5–5.1)
Sodium: 138 mmol/L (ref 135–145)

## 2018-08-31 LAB — CBC WITH DIFFERENTIAL/PLATELET
BASOS ABS: 0.1 10*3/uL (ref 0–0.1)
BASOS PCT: 1 %
EOS PCT: 4 %
Eosinophils Absolute: 0.4 10*3/uL (ref 0–0.7)
HCT: 41.6 % (ref 35.0–47.0)
Hemoglobin: 14.1 g/dL (ref 12.0–16.0)
LYMPHS PCT: 23 %
Lymphs Abs: 2 10*3/uL (ref 1.0–3.6)
MCH: 29.4 pg (ref 26.0–34.0)
MCHC: 34 g/dL (ref 32.0–36.0)
MCV: 86.6 fL (ref 80.0–100.0)
MONO ABS: 0.4 10*3/uL (ref 0.2–0.9)
Monocytes Relative: 5 %
Neutro Abs: 6.1 10*3/uL (ref 1.4–6.5)
Neutrophils Relative %: 67 %
PLATELETS: 320 10*3/uL (ref 150–440)
RBC: 4.8 MIL/uL (ref 3.80–5.20)
RDW: 14.6 % — AB (ref 11.5–14.5)
WBC: 9.1 10*3/uL (ref 3.6–11.0)

## 2018-08-31 LAB — TSH: TSH: 1.669 u[IU]/mL (ref 0.350–4.500)

## 2018-08-31 MED ORDER — HYDROXYZINE PAMOATE 50 MG PO CAPS: 50.0000 mg | ORAL_CAPSULE | Freq: Every evening | ORAL | 1 refills | Status: DC | PRN

## 2018-08-31 MED ORDER — ALPRAZOLAM 0.5 MG PO TABS
0.5000 mg | ORAL_TABLET | Freq: Once | ORAL | Status: AC
  Administered 2018-08-31: 0.5 mg via ORAL
  Filled 2018-08-31: qty 1

## 2018-08-31 MED ORDER — FLUOXETINE HCL 20 MG PO CAPS: 20.0000 mg | ORAL_CAPSULE | Freq: Every day | ORAL | 1 refills | Status: DC

## 2018-08-31 NOTE — ED Provider Notes (Signed)
[facility] Emergency Department Provider Note       Time seen: ----------------------------------------- 2:26 PM on 08/31/2018 -----------------------------------------   I have reviewed the triage vital signs and the nursing notes.  HISTORY   Chief Complaint Depression and Anxiety    HPI Jody [name] is a 72 y.o. female with a history of cystitis, depression, hypercalcemia, hyperglycemia, osteoarthritis who presents to the ED for increased depression and anxiety.  She denies any thoughts of hurting herself or anyone else at this time.  She denies any hallucinations.  Patient has a history of depression but states her medication is not effective.  Her doctor had encouraged trazodone at night which was not helping her.  She also cannot get a refill of her Xanax that she was taking to help her sleep.  She denies any other complaints.  Past Medical History:  Diagnosis Date  . Abnormal involuntary movement   . Acute cystitis   . Arthralgia of pelvic region and thigh   . Bilateral nonexudative age-related macular degeneration   . Bipolar disorder (HCC)   . Claustrophobia   . Combined form of senile cataract   . Coronary arteriosclerosis   . Decreased hearing   . Depression   . Diabetes (HCC)   . Displacement of lumbar intervertebral disc without myelopathy   . Dysuria   . Glaucoma   . Glycosuria   . Gout   . History of mania   . Hypercalcemia   . Hyperglycemia   . Hypothyroidism   . Kidney disease   . Macular degeneration   . Nocturia   . 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 Osteoarthritis    pelvic region and thigh  . Palpitations   . Plantar fasciitis   . Psoriasis   . Solitary pulmonary nodule present on computed tomography of lung   . Speaking difficulty   . Spinal stenosis of lumbar region   . Stage 3 chronic renal impairment associated with type 2 diabetes mellitus (HCC)   . Thoracic  and lumbosacral neuritis   . Tremor   . Vitreous degeneration     Patient Active Problem List   Diagnosis Date Noted  . Cardiac murmur 06/21/2018  . GAD (generalized anxiety disorder) 06/14/2018  . Recurrent major depressive disorder, in full remission (HCC) 03/08/2018  . Insomnia due to medical condition 03/08/2018  . Hypothyroidism due to medication 03/08/2018  . Type 2 diabetes mellitus with complication, with long-term current use of insulin (HCC) 03/08/2018  . Mixed hyperlipidemia 03/08/2018  . Bilateral lower extremity edema 03/08/2018  . Chronic kidney disease 03/08/2018  . Spinal stenosis of lumbar region with neurogenic claudication 03/08/2018    Past Surgical History:  Procedure Laterality Date  . APPENDECTOMY    . BACK SURGERY     rods placed  . carpel tunnel     right in 1996, left in 1994  . CATARACT EXTRACTION    . HYSTEROSCOPY  04/18/2013  . LAMINECTOMY  11/14/2009   L4-5 Lamfusion, [place] and repair dural tear  . NECK SURGERY     4-5 disc  . TOTAL HIP ARTHROPLASTY Right     Allergies Metformin and related; Ultram [tramadol]; and Penicillins  Social History Social History   Tobacco Use  . Smoking status: Never Smoker  . Smokeless tobacco: Never Used  Substance Use Topics  . Alcohol use: No    Frequency: Never  . Drug use: No   Review of Systems Constitutional: Negative  for fever.   Cardiovascular: Negative for chest pain. Respiratory: Negative for shortness of breath. Gastrointestinal: Negative for abdominal pain, vomiting and diarrhea. Musculoskeletal: Negative for back pain. Skin: Negative for rash. Neurological: Negative for headaches, focal weakness or numbness. Psychiatric: Positive for depression anxiety  All systems negative/normal/unremarkable except as stated in the HPI  ____________________________________________   PHYSICAL EXAM:  VITAL SIGNS: ED Triage Vitals  Enc Vitals Group     BP 08/31/18 1352 138/72     Pulse Rate  08/31/18 1352 69     Resp 08/31/18 1352 16     Temp 08/31/18 1352 98.8 F (37.1 C)     Temp Source 08/31/18 1352 Oral     SpO2 08/31/18 1352 96 %     Weight 08/31/18 1353 208 lb (94.3 kg)     Height 08/31/18 1353 5\' 4"  (1.626 m)     Head Circumference --      Peak Flow --      Pain Score 08/31/18 1353 5     Pain Loc --      Pain Edu? --      Excl. in GC? --    Constitutional: Alert and oriented. Well appearing and in no distress. Eyes: Conjunctivae are normal. Normal extraocular movements. ENT   Head: Normocephalic and atraumatic.   Nose: No congestion/rhinnorhea.   Mouth/Throat: Mucous membranes are moist.   Neck: No stridor. Cardiovascular: Normal rate, regular rhythm. No murmurs, rubs, or gallops. Respiratory: Normal respiratory effort without tachypnea nor retractions. Breath sounds are clear and equal bilaterally. No wheezes/rales/rhonchi. Gastrointestinal: Soft and nontender. Normal bowel sounds Musculoskeletal: Nontender with normal range of motion in extremities. No lower extremity tenderness nor edema. Neurologic:  Normal speech and language. No gross focal neurologic deficits are appreciated.  Skin:  Skin is warm, dry and intact. No rash noted. Psychiatric: Mood and affect are normal. Speech and behavior are normal.  ___________________________________________  ED COURSE:  As part of my medical decision making, I reviewed the following data within the electronic MEDICAL RECORD NUMBER History obtained from family if available, nursing notes, old chart and ekg, as well as notes from prior ED visits. Patient presented for depression and anxiety, we will assess with labs and imaging as indicated at this time.   Procedures ____________________________________________   LABS (pertinent positives/negatives)  Labs Reviewed  CBC WITH DIFFERENTIAL/PLATELET - Abnormal; Notable for the following components:      Result Value   RDW 14.6 (*)    All other components  within normal limits  BASIC METABOLIC PANEL - Abnormal; Notable for the following components:   Glucose, Bld 159 (*)    BUN 25 (*)    Creatinine, Ser 1.54 (*)    GFR calc non Af Amer 33 (*)    GFR calc Af Amer 38 (*)    All other components within normal limits  URINALYSIS, COMPLETE (UACMP) WITH MICROSCOPIC - Abnormal; Notable for the following components:   Color, Urine YELLOW (*)    APPearance CLEAR (*)    All other components within normal limits  TSH   ____________________________________________  DIFFERENTIAL DIAGNOSIS   Depression, anxiety, medication refill, hypothyroidism  FINAL ASSESSMENT AND PLAN  Depression, anxiety   Plan: The patient had presented for depression and anxiety. Patient's labs do not reveal any acute process.  She was evaluated by psychiatry and has prescriptions for Prozac and Vistaril.  She is cleared for outpatient follow-up.   Ulice Dash, MD   Note: This note was generated  in part or whole with voice recognition software. Voice recognition is usually quite accurate but there are transcription errors that can and very often do occur. I apologize for any typographical errors that were not detected and corrected.     Emily Filbert, MD 08/31/18 [phone]

## 2018-08-31 NOTE — ED Notes (Signed)
Pt discharged to home with daughter.  Pt verbalized understanding of discharge instructions and prescriptions provided.  Pt is alert and oriented x 4.  Denies SI, HI, and A/V hallucinations.

## 2018-08-31 NOTE — ED Triage Notes (Signed)
Pt called for triage no response ?

## 2018-08-31 NOTE — ED Triage Notes (Signed)
Pt comes into the ED via POV c/o increased depression and anxiety.  Patient denies any SI, HI, or hallucinations.  Patient has h/o depression but states her medication is ineffective at this time and she wants to seek help before it gets to the point of her being suicidal.  Patient is calm and cooperative at this time and in NAD. Patient has run out her anxiety medication and her physician refused to refill the medication and told her to come here.

## 2018-09-02 ENCOUNTER — Encounter: Payer: Self-pay | Admitting: Internal Medicine

## 2018-09-09 ENCOUNTER — Other Ambulatory Visit: Payer: Medicare Other

## 2018-09-09 DIAGNOSIS — E118 Type 2 diabetes mellitus with unspecified complications: Secondary | ICD-10-CM

## 2018-09-09 DIAGNOSIS — E032 Hypothyroidism due to medicaments and other exogenous substances: Secondary | ICD-10-CM

## 2018-09-09 DIAGNOSIS — Z794 Long term (current) use of insulin: Secondary | ICD-10-CM

## 2018-09-09 DIAGNOSIS — Z79899 Other long term (current) drug therapy: Secondary | ICD-10-CM

## 2018-09-10 ENCOUNTER — Other Ambulatory Visit: Payer: Self-pay | Admitting: Internal Medicine

## 2018-09-10 LAB — COMPLETE METABOLIC PANEL WITH GFR
AG RATIO: 1.8 (calc) (ref 1.0–2.5)
ALKALINE PHOSPHATASE (APISO): 94 U/L (ref 33–130)
ALT: 10 U/L (ref 6–29)
AST: 15 U/L (ref 10–35)
Albumin: 4.1 g/dL (ref 3.6–5.1)
BILIRUBIN TOTAL: 0.5 mg/dL (ref 0.2–1.2)
BUN / CREAT RATIO: 15 (calc) (ref 6–22)
BUN: 24 mg/dL (ref 7–25)
CHLORIDE: 106 mmol/L (ref 98–110)
CO2: 26 mmol/L (ref 20–32)
Calcium: 9.2 mg/dL (ref 8.6–10.4)
Creat: 1.63 mg/dL — ABNORMAL HIGH (ref 0.60–0.93)
GFR, EST AFRICAN AMERICAN: 36 mL/min/{1.73_m2} — AB (ref >=60)
GFR, Est Non African American: 31 mL/min/{1.73_m2} — ABNORMAL LOW (ref >=60)
Globulin: 2.3 g/dL (calc) (ref 1.9–3.7)
Glucose, Bld: 115 mg/dL (ref 65–139)
POTASSIUM: 4.7 mmol/L (ref 3.5–5.3)
Sodium: 141 mmol/L (ref 135–146)
TOTAL PROTEIN: 6.4 g/dL (ref 6.1–8.1)

## 2018-09-10 LAB — HEMOGLOBIN A1C
Hgb A1c MFr Bld: 5.9 % of total Hgb — ABNORMAL HIGH (ref <5.7)
MEAN PLASMA GLUCOSE: 123 (calc)
eAG (mmol/L): 6.8 (calc)

## 2018-09-10 LAB — LIPID PANEL
Cholesterol: 226 mg/dL — ABNORMAL HIGH (ref <200)
HDL: 40 mg/dL — AB (ref >50)
LDL CHOLESTEROL (CALC): 157 mg/dL — AB
NON-HDL CHOLESTEROL (CALC): 186 mg/dL — AB (ref <130)
TRIGLYCERIDES: 155 mg/dL — AB (ref <150)
Total CHOL/HDL Ratio: 5.7 (calc) — ABNORMAL HIGH (ref <5.0)

## 2018-09-10 LAB — TSH: TSH: 2.15 mIU/L (ref 0.40–4.50)

## 2018-09-12 MED ORDER — LEVOTHYROXINE SODIUM 112 MCG PO TABS: 112.0000 ug | ORAL_TABLET | Freq: Every day | ORAL | 1 refills | Status: DC

## 2018-09-14 ENCOUNTER — Other Ambulatory Visit: Payer: Self-pay | Admitting: Nurse Practitioner

## 2018-09-14 ENCOUNTER — Encounter: Payer: Self-pay | Admitting: Nurse Practitioner

## 2018-09-14 ENCOUNTER — Ambulatory Visit (INDEPENDENT_AMBULATORY_CARE_PROVIDER_SITE_OTHER): Payer: Medicare Other | Admitting: Nurse Practitioner

## 2018-09-14 VITALS — BP 124/72 | HR 73 | Temp 98.3°F | Ht 64.0 in | Wt 208.2 lb

## 2018-09-14 DIAGNOSIS — Z23 Encounter for immunization: Secondary | ICD-10-CM

## 2018-09-14 DIAGNOSIS — F411 Generalized anxiety disorder: Secondary | ICD-10-CM

## 2018-09-14 DIAGNOSIS — Z794 Long term (current) use of insulin: Secondary | ICD-10-CM

## 2018-09-14 DIAGNOSIS — N183 Chronic kidney disease, stage 3 unspecified: Secondary | ICD-10-CM

## 2018-09-14 DIAGNOSIS — E782 Mixed hyperlipidemia: Secondary | ICD-10-CM

## 2018-09-14 DIAGNOSIS — E118 Type 2 diabetes mellitus with unspecified complications: Secondary | ICD-10-CM | POA: Diagnosis not present

## 2018-09-14 MED ORDER — INSULIN GLARGINE 100 UNIT/ML SOLOSTAR PEN: 20.0000 [IU] | PEN_INJECTOR | Freq: Every day | SUBCUTANEOUS | 11 refills | Status: DC

## 2018-09-14 MED ORDER — ROSUVASTATIN CALCIUM 20 MG PO TABS: 20.0000 mg | ORAL_TABLET | Freq: Every day | ORAL | 3 refills | Status: AC

## 2018-09-14 MED ORDER — ALPRAZOLAM 0.25 MG PO TABS: 0.2500 mg | ORAL_TABLET | Freq: Every day | ORAL | 0 refills | Status: DC

## 2018-09-14 NOTE — Patient Instructions (Addendum)
REDUCE LANTUS TO 20 units, if fasting blood sugars staying over 140 units go back up to 25   Stop zocor and start Crestor   Follow up in 3 months with fasting blood work before visit.   Encourage proper hydration with water and to avoid NSAIDS (Aleve, Advil, Motrin, Ibuprofen)

## 2018-09-14 NOTE — Progress Notes (Signed)
Careteam: Patient Care Team: Kirt Boys, DO as PCP - General (Internal Medicine)  Advanced Directive information    Allergies  Allergen Reactions  . Metformin And Related Diarrhea  . Ultram [Tramadol] Other (See Comments)    falls  . Penicillins Rash    Chief Complaint  Patient presents with  . Medical Management of Chronic Issues    Patient here today with her daughter Leotis Shames to follow up on anxiety and labs. She does have an appointment to see psychiatrist 10/22.      HPI: Patient is a 72 y.o. female seen in the office today for follow up anxiety and depression. Went to ED on 08/31/18 due to worsening anxiety/depression. No HI/SI. Recommended outpatient follow up with therapist/psychiatrist. Currently on fluoxetine (changed from celexa by ED), started her on hydroxyzine and stopped buspar and continued her on  xanax. She was given trazodone to help her sleep but was not helping so she stopped medication- took for over a year.  Has appt with psychiatrist on 10/22. prozac has helped with depression, still has anxiety.  vistaril is helping with sleep- taking 50 mg PO QHS. Using xanax 0.25 mg as needed anxiety- grinds her teeth.     DM- lantus was decreased to 25 units. Last A1c 5.9. Checks blood sugar in the morning and ranging from 115-130, blood sugar of 121.  States she has had 3-4 "low" blood sugars but does not check her sugar to know how low it is, sweats and shakes when her blood sugar drops.  Would like to go down on her insulin, has made changes in diet and increasing activity.   CKD stage 3, had been following with nephrologist in [place] when she lived there, currently stable. Avoiding NSAIDS and staying hydrated with water  Review of Systems:  Review of Systems  Respiratory: Negative for shortness of breath.   Cardiovascular: Negative for chest pain.  Gastrointestinal: Negative for abdominal pain, blood in stool, constipation and diarrhea.  Genitourinary:  Negative for dysuria.  Neurological: Negative for tingling, weakness and headaches.  Psychiatric/Behavioral: Positive for depression. The patient is nervous/anxious and has insomnia.     Past Medical History:  Diagnosis Date  . Abnormal involuntary movement   . Acute cystitis   . Arthralgia of pelvic region and thigh   . Bilateral nonexudative age-related macular degeneration   . Bipolar disorder (HCC)   . Claustrophobia   . Combined form of senile cataract   . Coronary arteriosclerosis   . Decreased hearing   . Depression   . Diabetes (HCC)   . Displacement of lumbar intervertebral disc without myelopathy   . Dysuria   . Glaucoma   . Glycosuria   . Gout   . History of mania   . Hypercalcemia   . Hyperglycemia   . Hypothyroidism   . Kidney disease   . Macular degeneration   . Nocturia   . 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 Osteoarthritis    pelvic region and thigh  . Palpitations   . Plantar fasciitis   . Psoriasis   . Solitary pulmonary nodule present on computed tomography of lung   . Speaking difficulty   . Spinal stenosis of lumbar region   . Stage 3 chronic renal impairment associated with type 2 diabetes mellitus (HCC)   . Thoracic and lumbosacral neuritis   . Tremor   . Vitreous degeneration    Past Surgical History:  Procedure Laterality Date  .  APPENDECTOMY    . BACK SURGERY     rods placed  . carpel tunnel     right in 1996, left in 1994  . CATARACT EXTRACTION    . HYSTEROSCOPY  04/18/2013  . LAMINECTOMY  11/14/2009   L4-5 Lamfusion, [place] and repair dural tear  . NECK SURGERY     4-5 disc  . TOTAL HIP ARTHROPLASTY Right    Social History:   reports that she has never smoked. She has never used smokeless tobacco. She reports that she does not drink alcohol or use drugs.  Family History  Problem Relation Age of Onset  . Peripheral Artery Disease Mother   . Cancer Father      Medications: Patient's Medications  New Prescriptions   No medications on file  Previous Medications   ALPRAZOLAM (XANAX) 0.25 MG TABLET    TAKE 1 TABLET (0.25 MG TOTAL) BY MOUTH AT BEDTIME.   CETIRIZINE (ZYRTEC) 10 MG TABLET    Take 10 mg by mouth daily.   CITALOPRAM (CELEXA) 40 MG TABLET    TAKE 1 TABLET BY MOUTH EVERY DAY   CLOTRIMAZOLE (LOTRIMIN) 1 % CREAM    Apply 1 application topically daily as needed.   FLUOXETINE (PROZAC) 20 MG CAPSULE    Take 1 capsule (20 mg total) by mouth daily.   FUROSEMIDE (LASIX) 20 MG TABLET    Take 20 mg by mouth as needed.    GABAPENTIN (NEURONTIN) 300 MG CAPSULE    Take 1 capsule (300 mg total) by mouth daily as needed.   HYDROXYZINE (VISTARIL) 50 MG CAPSULE    Take 1 capsule (50 mg total) by mouth at bedtime as needed (sleep).   INSULIN GLARGINE (LANTUS SOLOSTAR) 100 UNIT/ML SOLOSTAR PEN    Inject 25 Units into the skin daily at 10 pm. For diabetes   LEVOTHYROXINE (SYNTHROID, LEVOTHROID) 112 MCG TABLET    Take 1 tablet (112 mcg total) by mouth daily before breakfast.   ONETOUCH VERIO TEST STRIP    Check blood sugar daily.   SIMVASTATIN (ZOCOR) 40 MG TABLET    Take 1 tablet (40 mg total) by mouth daily.   SITAGLIPTIN (JANUVIA) 50 MG TABLET    Take 1 tablet (50 mg total) by mouth daily.   TRAZODONE (DESYREL) 50 MG TABLET    Take 0.5 tablets (25 mg total) by mouth at bedtime as needed for sleep.   TRIAMCINOLONE LOTION (KENALOG) 0.1 %    Apply 1 application topically daily. To scalp and rash  Modified Medications   No medications on file  Discontinued Medications   No medications on file     Physical Exam:  Vitals:   09/14/18 1418  BP: 124/72  Pulse: 73  Temp: 98.3 F (36.8 C)  SpO2: 98%  Weight: 208 lb 3.2 oz (94.4 kg)  Height: 5\' 4"  (1.626 m)   Body mass index is 35.74 kg/m.  Physical Exam  Constitutional: She is oriented to person, place, and time. She appears well-developed and well-nourished.  Cardiovascular: Intact distal  pulses.  Murmur (1/6 SEM) heard. Pulmonary/Chest: Effort normal and breath sounds normal. No stridor. No respiratory distress. She has no wheezes. She has no rales. She exhibits no tenderness.  Musculoskeletal: She exhibits no edema.  Neurological: She is alert and oriented to person, place, and time.  Skin: Skin is warm and dry. No rash noted.  Psychiatric: She has a normal mood and affect. Her speech is normal and behavior is normal. Judgment and thought content normal.  Labs reviewed: Basic Metabolic Panel: Recent Labs    06/10/18 1140 08/31/18 1414 09/09/18 1019  NA 141 138 141  K 4.6 4.9 4.7  CL 109 102 106  CO2 22 27 26   GLUCOSE 103* 159* 115  BUN 24 25* 24  CREATININE 1.39* 1.54* 1.63*  CALCIUM 9.2 9.2 9.2  TSH 0.15* 1.669 2.15   Liver Function Tests: Recent Labs    03/08/18 1006 06/10/18 1140 09/09/18 1019  AST 15 13 15   ALT 14 12 10   BILITOT 0.4 0.5 0.5  PROT 6.4 5.9* 6.4   No results for input(s): LIPASE, AMYLASE in the last 8760 hours. No results for input(s): AMMONIA in the last 8760 hours. CBC: Recent Labs    03/08/18 1006 08/31/18 1414  WBC 7.2 9.1  NEUTROABS 4,507 6.1  HGB 12.5 14.1  HCT 38.3 41.6  MCV 85.3 86.6  PLT 313 320   Lipid Panel: Recent Labs    03/08/18 1006 06/10/18 1140 09/09/18 1019  CHOL 170 190 226*  HDL 44* 41* 40*  LDLCALC 105* 122* 157*  TRIG 116 152* 155*  CHOLHDL 3.9 4.6 5.7*   TSH: Recent Labs    06/10/18 1140 08/31/18 1414 09/09/18 1019  TSH 0.15* 1.669 2.15   A1C: Lab Results  Component Value Date   HGBA1C 5.9 (H) 09/09/2018     Assessment/Plan  1. GAD (generalized anxiety disorder) -improved on current regimen, plan to see psychiatrist  - ALPRAZolam (XANAX) 0.25 MG tablet; Take 1 tablet (0.25 mg total) by mouth at bedtime.  Dispense: 30 tablet; Refill: 0  2. Type 2 diabetes mellitus with complication, with long-term current use of insulin (HCC) Will reduce lantus to 20 units, attempting to  minimize insulin with diet and exercise modifications.  - Hemoglobin A1c - Insulin Glargine (LANTUS SOLOSTAR) 100 UNIT/ML Solostar Pen; Inject 20 Units into the skin daily at 10 pm. For diabetes  Dispense: 15 mL; Refill: 11  3. Mixed hyperlipidemia Not at goal, to stop zocor and start  - rosuvastatin (CRESTOR) 20 MG tablet; Take 1 tablet (20 mg total) by mouth daily.  Dispense: 90 tablet; Refill: 3 - COMPLETE METABOLIC PANEL WITH GFR - Lipid Panel  4. CKD (chronic kidney disease) stage 3, GFR 30-59 ml/min (HCC) -Encourage proper hydration and to avoid NSAIDS (Aleve, Advil, Motrin, Ibuprofen)  - COMPLETE METABOLIC PANEL WITH GFR   Next appt:  Joannah Gitlin K. Biagio Borg  [facility] [phone]

## 2018-10-27 ENCOUNTER — Other Ambulatory Visit: Payer: Self-pay | Admitting: *Deleted

## 2018-10-27 MED ORDER — FLUOXETINE HCL 20 MG PO CAPS: 20.0000 mg | ORAL_CAPSULE | Freq: Every day | ORAL | 1 refills | Status: AC

## 2018-10-27 MED ORDER — HYDROXYZINE PAMOATE 50 MG PO CAPS: 50.0000 mg | ORAL_CAPSULE | Freq: Every evening | ORAL | 1 refills | Status: DC | PRN

## 2018-10-27 NOTE — Telephone Encounter (Signed)
Patient requested refill Last OV reviewed.

## 2018-11-02 ENCOUNTER — Telehealth: Payer: Self-pay | Admitting: *Deleted

## 2018-11-02 NOTE — Telephone Encounter (Signed)
Received fax from [facility] #[phone] for Prior Authorization for Hydroxyzine 50mg . Form given to Shanda BumpsJessica to fill out and sign. To be faxed back to [facility] Fax:[phone]

## 2018-11-07 NOTE — Telephone Encounter (Signed)
Received fax from [employer] and Hydroxyzine was APPROVED 08/04/18-11/02/19 Member ID: [id number]

## 2018-11-09 ENCOUNTER — Other Ambulatory Visit: Payer: Self-pay

## 2018-11-09 ENCOUNTER — Emergency Department
Admission: EM | Admit: 2018-11-09 | Discharge: 2018-11-09 | Disposition: A | Discharge status: Home / Self Care | Payer: Medicare Other | Attending: Emergency Medicine | Admitting: Emergency Medicine

## 2018-11-09 DIAGNOSIS — E1122 Type 2 diabetes mellitus with diabetic chronic kidney disease: Secondary | ICD-10-CM | POA: Diagnosis not present

## 2018-11-09 DIAGNOSIS — E039 Hypothyroidism, unspecified: Secondary | ICD-10-CM | POA: Insufficient documentation

## 2018-11-09 DIAGNOSIS — I129 Hypertensive chronic kidney disease with stage 1 through stage 4 chronic kidney disease, or unspecified chronic kidney disease: Secondary | ICD-10-CM | POA: Diagnosis not present

## 2018-11-09 DIAGNOSIS — R42 Dizziness and giddiness: Secondary | ICD-10-CM | POA: Diagnosis not present

## 2018-11-09 DIAGNOSIS — N183 Chronic kidney disease, stage 3 (moderate): Secondary | ICD-10-CM | POA: Insufficient documentation

## 2018-11-09 DIAGNOSIS — Z794 Long term (current) use of insulin: Secondary | ICD-10-CM | POA: Insufficient documentation

## 2018-11-09 DIAGNOSIS — Z79899 Other long term (current) drug therapy: Secondary | ICD-10-CM | POA: Insufficient documentation

## 2018-11-09 DIAGNOSIS — L03115 Cellulitis of right lower limb: Secondary | ICD-10-CM | POA: Diagnosis not present

## 2018-11-09 DIAGNOSIS — R21 Rash and other nonspecific skin eruption: Secondary | ICD-10-CM | POA: Diagnosis present

## 2018-11-09 MED ORDER — CLINDAMYCIN HCL 150 MG PO CAPS
ORAL_CAPSULE | ORAL | Status: AC
  Administered 2018-11-09: 300 mg via ORAL
  Filled 2018-11-09: qty 2

## 2018-11-09 MED ORDER — CLINDAMYCIN HCL 150 MG PO CAPS
300.0000 mg | ORAL_CAPSULE | Freq: Once | ORAL | Status: AC
  Administered 2018-11-09: 300 mg via ORAL
  Filled 2018-11-09: qty 2

## 2018-11-09 MED ORDER — CLINDAMYCIN HCL 300 MG PO CAPS: 300.0000 mg | ORAL_CAPSULE | Freq: Three times a day (TID) | ORAL | 0 refills | Status: AC

## 2018-11-09 NOTE — ED Notes (Signed)
Edges of cellulitis marked with skin pen per this RN.

## 2018-11-09 NOTE — ED Provider Notes (Signed)
[facility] Emergency Department Provider Note  Time seen: 10:01 PM  I have reviewed the triage vital signs and the nursing notes.   HISTORY  Chief Complaint Wound Infection    HPI Jody [name] is a 72 y.o. female with a past medical history of bipolar, diabetes, presents to the emergency department for right leg rash.  According to the patient she got out of the shower and felt a little lightheaded per patient.  States she looked at her leg and noticed a very tender red spot that felt like it was burning per patient.  States over the past 5 or 6 hours the redness has enlarged along with blisters to the area.  Patient denies any known trauma to the area.  No leg swelling.  No chest pain or trouble breathing.  No known fever.   Past Medical History:  Diagnosis Date  . Abnormal involuntary movement   . Acute cystitis   . Arthralgia of pelvic region and thigh   . Bilateral nonexudative age-related macular degeneration   . Bipolar disorder (HCC)   . Claustrophobia   . Combined form of senile cataract   . Coronary arteriosclerosis   . Decreased hearing   . Depression   . Diabetes (HCC)   . Displacement of lumbar intervertebral disc without myelopathy   . Dysuria   . Glaucoma   . Glycosuria   . Gout   . History of mania   . Hypercalcemia   . Hyperglycemia   . Hypothyroidism   . Kidney disease   . Macular degeneration   . Nocturia   . 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 Osteoarthritis    pelvic region and thigh  . Palpitations   . Plantar fasciitis   . Psoriasis   . Solitary pulmonary nodule present on computed tomography of lung   . Speaking difficulty   . Spinal stenosis of lumbar region   . Stage 3 chronic renal impairment associated with type 2 diabetes mellitus (HCC)   . Thoracic and lumbosacral neuritis   . Tremor   . Vitreous degeneration     Patient Active Problem List   Diagnosis Date  Noted  . Cardiac murmur 06/21/2018  . GAD (generalized anxiety disorder) 06/14/2018  . Recurrent major depressive disorder, in full remission (HCC) 03/08/2018  . Insomnia due to medical condition 03/08/2018  . Hypothyroidism due to medication 03/08/2018  . Type 2 diabetes mellitus with complication, with long-term current use of insulin (HCC) 03/08/2018  . Mixed hyperlipidemia 03/08/2018  . Bilateral lower extremity edema 03/08/2018  . Chronic kidney disease 03/08/2018  . Spinal stenosis of lumbar region with neurogenic claudication 03/08/2018    Past Surgical History:  Procedure Laterality Date  . APPENDECTOMY    . BACK SURGERY     rods placed  . carpel tunnel     right in 1996, left in 1994  . CATARACT EXTRACTION    . HYSTEROSCOPY  04/18/2013  . LAMINECTOMY  11/14/2009   L4-5 Lamfusion, [place] and repair dural tear  . NECK SURGERY     4-5 disc  . TOTAL HIP ARTHROPLASTY Right     Prior to Admission medications   Medication Sig Start Date End Date Taking? Authorizing Provider  ALPRAZolam (XANAX) 0.25 MG tablet Take 1 tablet (0.25 mg total) by mouth at bedtime. 09/14/18   Sharon Seller, NP  cetirizine (ZYRTEC) 10 MG tablet Take 10 mg by mouth daily.    [provider]  clotrimazole (LOTRIMIN) 1 % cream Apply 1 application topically daily as needed.    [provider]  FLUoxetine (PROZAC) 20 MG capsule Take 1 capsule (20 mg total) by mouth daily. 10/27/18 06/18/20  Sharon SellerEubanks, Jessica K, NP  furosemide (LASIX) 20 MG tablet Take 20 mg by mouth as needed.     [provider]  gabapentin (NEURONTIN) 300 MG capsule Take 1 capsule (300 mg total) by mouth daily as needed. 04/15/18   Kirt Boysarter, Monica, DO  hydrOXYzine (VISTARIL) 50 MG capsule Take 1 capsule (50 mg total) by mouth at bedtime as needed (sleep). 10/27/18   Sharon SellerEubanks, Jessica K, NP  Insulin Glargine (LANTUS SOLOSTAR) 100 UNIT/ML Solostar Pen Inject 20 Units into the skin daily at 10 pm. For diabetes  09/14/18   Sharon SellerEubanks, Jessica K, NP  levothyroxine (SYNTHROID, LEVOTHROID) 112 MCG tablet Take 1 tablet (112 mcg total) by mouth daily before breakfast. 09/12/18   Kirt Boysarter, Monica, DO  [facility] Check blood sugar daily. 05/14/18   [provider]  rosuvastatin (CRESTOR) 20 MG tablet Take 1 tablet (20 mg total) by mouth daily. 09/14/18   Sharon SellerEubanks, Jessica K, NP  sitaGLIPtin (JANUVIA) 50 MG tablet Take 1 tablet (50 mg total) by mouth daily. 07/15/18   Kirt Boysarter, Monica, DO  triamcinolone lotion (KENALOG) 0.1 % Apply 1 application topically daily. To scalp and rash 06/14/18   Kirt Boysarter, Monica, DO    Allergies  Allergen Reactions  . Metformin And Related Diarrhea  . Ultram [Tramadol] Other (See Comments)    falls  . Penicillins Rash    Family History  Problem Relation Age of Onset  . Peripheral Artery Disease Mother   . Cancer Father     Social History Social History   Tobacco Use  . Smoking status: Never Smoker  . Smokeless tobacco: Never Used  Substance Use Topics  . Alcohol use: No    Frequency: Never  . Drug use: No    Review of Systems Constitutional: Negative for fever. Cardiovascular: Negative for chest pain. Respiratory: Negative for shortness of breath. Gastrointestinal: Negative for abdominal pain Musculoskeletal: Redness/rash to right leg Skin: Rash/blistering to right leg Neurological: Negative for headache All other ROS negative  ____________________________________________   PHYSICAL EXAM:  VITAL SIGNS: ED Triage Vitals  Enc Vitals Group     BP 11/09/18 2017 128/76     Pulse Rate 11/09/18 2016 99     Resp 11/09/18 2016 20     Temp 11/09/18 2015 98.8 F (37.1 C)     Temp Source 11/09/18 2015 Oral     SpO2 11/09/18 2016 97 %     Weight 11/09/18 2015 197 lb (89.4 kg)     Height 11/09/18 2015 5\' 4"  (1.626 m)     Head Circumference --      Peak Flow --      Pain Score 11/09/18 2015 5     Pain Loc --      Pain Edu? --      Excl. in GC? --     Constitutional: Alert and oriented. Well appearing and in no distress. Eyes: Normal exam ENT   Head: Normocephalic and atraumatic.   Mouth/Throat: Mucous membranes are moist. Cardiovascular: Normal rate, regular rhythm.  Respiratory: Normal respiratory effort without tachypnea nor retractions. Breath sounds are clear  Gastrointestinal: Soft and nontender. No distention.   Musculoskeletal: Patient has an area approximately 8 x 4 cm of erythema with central blistering.  Minimal lymphatic streaking.  Nontender popliteal fossa, no lower extremity edema. Neurologic:  Normal speech and language. No gross focal neurologic deficits  Skin:  Skin is warm, dry and intact.  Psychiatric: Mood and affect are normal.   ____________________________________________    INITIAL IMPRESSION / ASSESSMENT AND PLAN / ED COURSE  Pertinent labs & imaging results that were available during my care of the patient were reviewed by me and considered in my medical decision making (see chart for details).  Patient presents to the emergency department for evaluation of a rash to her right lower extremity approximately 8 x 4 cm with minimal lymphatic streaking.  Rash is very consistent with a localized area of cellulitis.  I reviewed the patient's old lab work she has moderate CKD at baseline we will start the patient on clindamycin.  We will outline the area with a skin marker.  I discussed with the patient if the area continues to enlarge or she develops a fever 100.4 or higher she should return to the emergency department.  Otherwise she will follow-up with her doctor.  Patient agreeable to plan of care.  ____________________________________________   FINAL CLINICAL IMPRESSION(S) / ED DIAGNOSES  Cellulitis    Minna Antis, MD 11/09/18 2204

## 2018-11-09 NOTE — ED Triage Notes (Addendum)
Pt in with co redness to area in right lower leg states feels like burning. Not sure of insect bite or cause. Pt ambulatory to triage.

## 2018-11-24 ENCOUNTER — Encounter: Payer: Self-pay | Admitting: Nurse Practitioner

## 2018-12-21 ENCOUNTER — Other Ambulatory Visit: Payer: Self-pay | Admitting: Nurse Practitioner

## 2018-12-28 ENCOUNTER — Ambulatory Visit: Payer: Self-pay

## 2018-12-28 ENCOUNTER — Other Ambulatory Visit: Payer: Medicare Other

## 2018-12-28 ENCOUNTER — Other Ambulatory Visit: Payer: 59

## 2018-12-28 DIAGNOSIS — Z794 Long term (current) use of insulin: Principal | ICD-10-CM

## 2018-12-28 DIAGNOSIS — E782 Mixed hyperlipidemia: Secondary | ICD-10-CM

## 2018-12-28 DIAGNOSIS — E118 Type 2 diabetes mellitus with unspecified complications: Secondary | ICD-10-CM

## 2018-12-29 LAB — COMPLETE METABOLIC PANEL WITH GFR
AG Ratio: 1.6 (calc) (ref 1.0–2.5)
ALT: 16 U/L (ref 6–29)
AST: 18 U/L (ref 10–35)
Albumin: 3.8 g/dL (ref 3.6–5.1)
Alkaline phosphatase (APISO): 97 U/L (ref 33–130)
BILIRUBIN TOTAL: 0.5 mg/dL (ref 0.2–1.2)
BUN/Creatinine Ratio: 15 (calc) (ref 6–22)
BUN: 22 mg/dL (ref 7–25)
CHLORIDE: 107 mmol/L (ref 98–110)
CO2: 27 mmol/L (ref 20–32)
Calcium: 9.1 mg/dL (ref 8.6–10.4)
Creat: 1.44 mg/dL — ABNORMAL HIGH (ref 0.60–0.93)
GFR, Est African American: 42 mL/min/{1.73_m2} — ABNORMAL LOW (ref >=60)
GFR, Est Non African American: 36 mL/min/{1.73_m2} — ABNORMAL LOW (ref >=60)
GLUCOSE: 122 mg/dL — AB (ref 65–99)
Globulin: 2.4 g/dL (calc) (ref 1.9–3.7)
Potassium: 5 mmol/L (ref 3.5–5.3)
Sodium: 142 mmol/L (ref 135–146)
TOTAL PROTEIN: 6.2 g/dL (ref 6.1–8.1)

## 2018-12-29 LAB — LIPID PANEL
CHOL/HDL RATIO: 4.8 (calc) (ref <5.0)
Cholesterol: 226 mg/dL — ABNORMAL HIGH (ref <200)
HDL: 47 mg/dL — ABNORMAL LOW (ref >50)
LDL Cholesterol (Calc): 145 mg/dL (calc) — ABNORMAL HIGH
NON-HDL CHOLESTEROL (CALC): 179 mg/dL — AB (ref <130)
TRIGLYCERIDES: 195 mg/dL — AB (ref <150)

## 2018-12-29 LAB — HEMOGLOBIN A1C
Hgb A1c MFr Bld: 6.7 % of total Hgb — ABNORMAL HIGH (ref <5.7)
MEAN PLASMA GLUCOSE: 146 (calc)
eAG (mmol/L): 8.1 (calc)

## 2018-12-30 ENCOUNTER — Encounter: Payer: Self-pay | Admitting: Nurse Practitioner

## 2018-12-30 ENCOUNTER — Ambulatory Visit (INDEPENDENT_AMBULATORY_CARE_PROVIDER_SITE_OTHER): Payer: Medicare Other | Admitting: Nurse Practitioner

## 2018-12-30 ENCOUNTER — Ambulatory Visit: Payer: Medicare Other | Admitting: Family

## 2018-12-30 VITALS — BP 122/80 | HR 65 | Temp 98.0°F | Ht 64.0 in | Wt 198.0 lb

## 2018-12-30 VITALS — BP 120/80 | HR 65 | Temp 98.0°F | Ht 64.0 in | Wt 198.0 lb

## 2018-12-30 DIAGNOSIS — E118 Type 2 diabetes mellitus with unspecified complications: Secondary | ICD-10-CM

## 2018-12-30 DIAGNOSIS — N183 Chronic kidney disease, stage 3 unspecified: Secondary | ICD-10-CM

## 2018-12-30 DIAGNOSIS — E2839 Other primary ovarian failure: Secondary | ICD-10-CM

## 2018-12-30 DIAGNOSIS — E782 Mixed hyperlipidemia: Secondary | ICD-10-CM | POA: Diagnosis not present

## 2018-12-30 DIAGNOSIS — Z1231 Encounter for screening mammogram for malignant neoplasm of breast: Secondary | ICD-10-CM

## 2018-12-30 DIAGNOSIS — F411 Generalized anxiety disorder: Secondary | ICD-10-CM | POA: Diagnosis not present

## 2018-12-30 DIAGNOSIS — Z794 Long term (current) use of insulin: Secondary | ICD-10-CM

## 2018-12-30 DIAGNOSIS — Z0000 Encounter for general adult medical examination without abnormal findings: Secondary | ICD-10-CM | POA: Diagnosis not present

## 2018-12-30 DIAGNOSIS — S81801D Unspecified open wound, right lower leg, subsequent encounter: Secondary | ICD-10-CM

## 2018-12-30 MED ORDER — ZOSTER VAC RECOMB ADJUVANTED 50 MCG/0.5ML IM SUSR: 0.5000 mL | Freq: Once | INTRAMUSCULAR | 1 refills | Status: AC

## 2018-12-30 NOTE — Progress Notes (Signed)
Subjective:   Jody [name] is a 73 y.o. female who presents for Medicare Annual (Subsequent) preventive examination.  Review of Systems:   Cardiac Risk Factors include: sedentary lifestyle;obesity (BMI >30kg/m2);dyslipidemia;hypertension;advanced age (>23men, >36 women);diabetes mellitus;family history of premature cardiovascular disease     Objective:     Vitals: BP 122/80   Pulse 65   Temp 98 F (36.7 C) (Oral)   Ht 5\' 4"  (1.626 m)   Wt 198 lb (89.8 kg)   SpO2 97%   BMI 33.99 kg/m   Body mass index is 33.99 kg/m.  Advanced Directives 12/30/2018 08/31/2018 08/10/2018  Does Patient Have a Medical Advance Directive? No Yes Yes  Type of Advance Directive - Healthcare Power of Attorney -  Does patient want to make changes to medical advance directive? - - Yes (MAU/Ambulatory/Procedural Areas - Information given)  Would patient like information on creating a medical advance directive? Yes (MAU/Ambulatory/Procedural Areas - Information given) - No - Patient declined    Tobacco Social History   Tobacco Use  Smoking Status Never Smoker  Smokeless Tobacco Never Used     Counseling given: Not Answered   Clinical Intake:  Pre-visit preparation completed: Yes  Pain : 0-10 Pain Score: 3  Pain Type: Acute pain Pain Location: Leg Pain Orientation: Left, Right Pain Descriptors / Indicators: Cramping Pain Onset: More than a month ago Pain Frequency: Constant Pain Relieving Factors: taking supplement which helps.  Effect of Pain on Daily Activities: walks slower due to discomfort  Pain Relieving Factors: taking supplement which helps.   BMI - recorded: 33.99 Nutritional Status: BMI > 30  Obese Diabetes: Yes CBG done?: Yes Did pt. bring in CBG monitor from home?: No  How often do you need to have someone help you when you read instructions, pamphlets, or other written materials from your doctor or pharmacy?: 1 - Never What is the last grade level you completed in  school?: 12th plus trade school  Interpreter Needed?: No     Past Medical History:  Diagnosis Date  . Abnormal involuntary movement   . Acute cystitis   . Arthralgia of pelvic region and thigh   . Bilateral nonexudative age-related macular degeneration   . Bipolar disorder (HCC)   . Claustrophobia   . Combined form of senile cataract   . Coronary arteriosclerosis   . Decreased hearing   . Depression   . Diabetes (HCC)   . Displacement of lumbar intervertebral disc without myelopathy   . Dysuria   . Glaucoma   . Glycosuria   . Gout   . History of mania   . Hypercalcemia   . Hyperglycemia   . Hypothyroidism   . Kidney disease   . Macular degeneration   . Nocturia   . 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 Osteoarthritis    pelvic region and thigh  . Palpitations   . Plantar fasciitis   . Psoriasis   . Solitary pulmonary nodule present on computed tomography of lung   . Speaking difficulty   . Spinal stenosis of lumbar region   . Stage 3 chronic renal impairment associated with type 2 diabetes mellitus (HCC)   . Thoracic and lumbosacral neuritis   . Tremor   . Vitreous degeneration    Past Surgical History:  Procedure Laterality Date  . APPENDECTOMY    . BACK SURGERY     rods placed  . carpel tunnel     right in 1996, left in  1994  . CATARACT EXTRACTION    . HYSTEROSCOPY  04/18/2013  . LAMINECTOMY  11/14/2009   L4-5 Lamfusion, [place] and repair dural tear  . NECK SURGERY     4-5 disc  . TOTAL HIP ARTHROPLASTY Right    Family History  Problem Relation Age of Onset  . Peripheral Artery Disease Mother   . Cancer Father    Social History   Socioeconomic History  . Marital status: Divorced    Spouse name: Not on file  . Number of children: Not on file  . Years of education: Not on file  . Highest education level: Not on file  Occupational History  . Not on file  Social Needs  . Financial resource strain: Not  on file  . Food insecurity:    Worry: Not on file    Inability: Not on file  . Transportation needs:    Medical: Not on file    Non-medical: Not on file  Tobacco Use  . Smoking status: Never Smoker  . Smokeless tobacco: Never Used  Substance and Sexual Activity  . Alcohol use: No    Frequency: Never  . Drug use: No  . Sexual activity: Not Currently  Lifestyle  . Physical activity:    Days per week: Not on file    Minutes per session: Not on file  . Stress: Not on file  Relationships  . Social connections:    Talks on phone: Not on file    Gets together: Not on file    Attends religious service: Not on file    Active member of club or organization: Not on file    Attends meetings of clubs or organizations: Not on file    Relationship status: Not on file  Other Topics Concern  . Not on file  Social History Narrative   Social History      Diet?      Do you drink/eat things with caffeine? Coffee, soda      Marital status?            divorced                        What year were you married? 1969      Do you live in a house, apartment, assisted living, condo, trailer, etc.? house      Is it one or more stories? 2 story      How many persons live in your home? 2      Do you have any pets in your home? (please list) 1 dog, 3 cats      Highest level of education completed? Beautician school      Current or past profession: Tree surgeonbeautician, bus driver (school)      Do you exercise?    no                                  Type & how often?      Advanced Directives      Do you have a living will? no      Do you have a DNR form?                                  If not, do you want to discuss one? no      Do  you have signed POA/HPOA for forms?  yes      Functional Status      Do you have difficulty bathing or dressing yourself? no      Do you have difficulty preparing food or eating? no      Do you have difficulty managing your medications? no      Do you have  difficulty managing your finances? no      Do you have difficulty affording your medications? no    Outpatient Encounter Medications as of 12/30/2018  Medication Sig  . ALPRAZolam (XANAX) 0.25 MG tablet Take 1 tablet (0.25 mg total) by mouth at bedtime.  . cetirizine (ZYRTEC) 10 MG tablet Take 10 mg by mouth daily.  . clotrimazole (LOTRIMIN) 1 % cream Apply 1 application topically daily as needed.  Marland Kitchen FLUoxetine (PROZAC) 20 MG capsule Take 1 capsule (20 mg total) by mouth daily.  . furosemide (LASIX) 20 MG tablet Take 20 mg by mouth as needed.   . hydrOXYzine (VISTARIL) 50 MG capsule TAKE 1 CAPSULE (50 MG TOTAL) BY MOUTH AT BEDTIME AS NEEDED (SLEEP).  . Insulin Glargine (LANTUS SOLOSTAR) 100 UNIT/ML Solostar Pen Inject 20 Units into the skin daily at 10 pm. For diabetes  . levothyroxine (SYNTHROID, LEVOTHROID) 112 MCG tablet Take 1 tablet (112 mcg total) by mouth daily before breakfast.  . ONETOUCH VERIO test strip Check blood sugar daily.  . rosuvastatin (CRESTOR) 20 MG tablet Take 1 tablet (20 mg total) by mouth daily.  . sitaGLIPtin (JANUVIA) 50 MG tablet Take 1 tablet (50 mg total) by mouth daily.  Marland Kitchen triamcinolone lotion (KENALOG) 0.1 % Apply 1 application topically daily. To scalp and rash  . Zoster Vaccine Adjuvanted [facility]) injection Inject 0.5 mLs into the muscle once for 1 dose.  . [DISCONTINUED] Zoster Vaccine Adjuvanted [facility]) injection Inject 0.5 mLs into the muscle once.  . [DISCONTINUED] gabapentin (NEURONTIN) 300 MG capsule Take 1 capsule (300 mg total) by mouth daily as needed. (Patient not taking: Reported on 12/30/2018)   No facility-administered encounter medications on file as of 12/30/2018.     Activities of Daily Living In your present state of health, do you have any difficulty performing the following activities: 12/30/2018  Hearing? Y  Comment wears hearing aids  Vision? N  Difficulty concentrating or making decisions? Y  Walking or climbing stairs? Y    Comment due to cramps  Dressing or bathing? N  Doing errands, shopping? N  Preparing Food and eating ? N  Using the Toilet? N  In the past six months, have you accidently leaked urine? Y  Comment wears pad  Do you have problems with loss of bowel control? N  Managing your Medications? N  Managing your Finances? N  Housekeeping or managing your Housekeeping? N  Some recent data might be hidden    Patient Care Team: Sharon Seller, NP as PCP - General (Geriatric Medicine)    Assessment:   This is a routine wellness examination for Arica.  Exercise Activities and Dietary recommendations Current Exercise Habits: Home exercise routine, Exercise limited by: psychological condition(s)  Goals    . Diabetes Patient stated goal (pt-stated)     Would like to get off insulin with diet modifications.        Fall Risk Fall Risk  12/30/2018 12/30/2018 09/14/2018 07/15/2018 05/17/2018  Falls in the past year? 1 1 Yes Yes Yes  Number falls in past yr: 0 0 2 or more 2 or more 1  Injury with Fall? 0 0 No Yes Yes  Comment - - - - -   Is the patient's home free of loose throw rugs in walkways, pet beds, electrical cords, etc?   no      Grab bars in the bathroom? yes      Handrails on the stairs?   yes      Adequate lighting?   yes  Timed Get Up and Go performed: n/a  Depression Screen PHQ 2/9 Scores 12/30/2018 05/17/2018  PHQ - 2 Score 0 0     Cognitive Function MMSE - Mini Mental State Exam 12/30/2018  Orientation to time 3  Orientation to Place 4  Registration 3  Attention/ Calculation 5  Recall 3  Language- name 2 objects 2  Language- repeat 1  Language- follow 3 step command 3  Language- read & follow direction 1  Write a sentence 1  Copy design 1  Total score 27        Immunization History  Administered Date(s) Administered  . Influenza, High Dose Seasonal PF 09/14/2018  . Influenza-Unspecified 09/14/2014  . Pneumococcal Conjugate-13 07/15/2018  . Pneumococcal  Polysaccharide-23 08/10/2013  . Tdap 12/09/2010  . Zoster 09/24/2008    Qualifies for Shingles Vaccine? Yes, had zoster  Screening Tests Health Maintenance  Topic Date Due  . OPHTHALMOLOGY EXAM  10/17/2013  . Hepatitis C Screening  07/16/2019 (Originally 08-26-1946)  . DEXA SCAN  12/31/2019 (Originally 03/29/2011)  . FOOT EXAM  03/09/2019  . URINE MICROALBUMIN  03/09/2019  . HEMOGLOBIN A1C  06/28/2019  . MAMMOGRAM  07/13/2019  . TETANUS/TDAP  12/09/2020  . COLONOSCOPY  03/11/2021  . INFLUENZA VACCINE  Completed  . PNA vac Low Risk Adult  Completed    Cancer Screenings: Lung: Low Dose CT Chest recommended if Age 53-80 years, 30 pack-year currently smoking OR have quit w/in 15years. Patient does not qualify. Breast:  Up to date on Mammogram? No   Up to date of Bone Density/Dexa? No Colorectal: would like cologuard.   Additional Screenings: Hepatitis C Screening: none.      Plan:     I have personally reviewed and noted the following in the patient's chart:   . Medical and social history . Use of alcohol, tobacco or illicit drugs  . Current medications and supplements . Functional ability and status . Nutritional status . Physical activity . Advanced directives . List of other physicians . Hospitalizations, surgeries, and ER visits in previous 12 months . Vitals . Screenings to include cognitive, depression, and falls . Referrals and appointments  In addition, I have reviewed and discussed with patient certain preventive protocols, quality metrics, and best practice recommendations. A written personalized care plan for preventive services as well as general preventive health recommendations were provided to patient.     Sharon SellerJessica K Davon Folta, NP  12/30/2018

## 2018-12-30 NOTE — Progress Notes (Signed)
Careteam: Patient Care Team: Sharon Seller, NP as PCP - General (Geriatric Medicine)  Advanced Directive information    Allergies  Allergen Reactions  . Metformin And Related Diarrhea  . Ultram [Tramadol] Other (See Comments)    falls  . Penicillins Rash    Chief Complaint  Patient presents with  . Medical Management of Chronic Issues    3 month follow-up and discuss labs (copy printed)      HPI: Patient is a 73 y.o. female seen in the office today for routine follow up after AVS.  Went to the ED for cellulitis on 11/09/18, large open area medial left leg. States it was twice the size it is today. Doing better but bleeding and has not healed. Gets a scab but when she showers the scab comes off. Continues to be painful but has improved. No heat. No purulent drainage, will bleed at times.  GAD/bipolar, manic- had planned to see psychiatrist and has been following with one. Would like to increase prozac but wants labs from todays visit. Continues on prozac and using xanax rarely.   DM- family trying to get her off lantus.  A1c was 5.9 and now up to 6.7 (pt states "it was christmas") lantus was decreased to 20 units. Has not taken blood sugars. No low blood sugars. Also taking Taking januvia 50 mg daily    CKD stage 3, had been following with nephrologist in [place] when she lived there, currently stable. Avoiding NSAIDS and staying hydrated with water. Cr stable at 1.44  Hyperlipidemia- taking crestor 20 mg daily, LDL 145 on most recent labs this has improved 157. Having leg and hand cramps.   Hypothyroid- taking synthroid 112 mcg  TSH 2.15 in oct 2019   Review of Systems:  Review of Systems  Constitutional: Negative for chills, fever and weight loss.  HENT: Positive for hearing loss. Negative for ear pain and tinnitus.   Respiratory: Negative for cough and shortness of breath.   Cardiovascular: Negative for chest pain.  Gastrointestinal: Negative for abdominal  pain, blood in stool, constipation and diarrhea.  Genitourinary: Negative for dysuria.  Skin:       Wound to right leg  Neurological: Negative for tingling, weakness and headaches.  Psychiatric/Behavioral: Positive for depression. The patient is nervous/anxious and has insomnia.     Past Medical History:  Diagnosis Date  . Abnormal involuntary movement   . Acute cystitis   . Arthralgia of pelvic region and thigh   . Bilateral nonexudative age-related macular degeneration   . Bipolar disorder (HCC)   . Claustrophobia   . Combined form of senile cataract   . Coronary arteriosclerosis   . Decreased hearing   . Depression   . Diabetes (HCC)   . Displacement of lumbar intervertebral disc without myelopathy   . Dysuria   . Glaucoma   . Glycosuria   . Gout   . History of mania   . Hypercalcemia   . Hyperglycemia   . Hypothyroidism   . Kidney disease   . Macular degeneration   . Nocturia   . 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 Osteoarthritis    pelvic region and thigh  . Palpitations   . Plantar fasciitis   . Psoriasis   . Solitary pulmonary nodule present on computed tomography of lung   . Speaking difficulty   . Spinal stenosis of lumbar region   . Stage 3 chronic renal impairment associated with type 2  diabetes mellitus (HCC)   . Thoracic and lumbosacral neuritis   . Tremor   . Vitreous degeneration    Past Surgical History:  Procedure Laterality Date  . APPENDECTOMY    . BACK SURGERY     rods placed  . carpel tunnel     right in 1996, left in 1994  . CATARACT EXTRACTION    . HYSTEROSCOPY  04/18/2013  . LAMINECTOMY  11/14/2009   L4-5 Lamfusion, [place] and repair dural tear  . NECK SURGERY     4-5 disc  . TOTAL HIP ARTHROPLASTY Right    Social History:   reports that she has never smoked. She has never used smokeless tobacco. She reports that she does not drink alcohol or use drugs.  Family History  Problem  Relation Age of Onset  . Peripheral Artery Disease Mother   . Cancer Father     Medications: Patient's Medications  New Prescriptions   No medications on file  Previous Medications   ALPRAZOLAM (XANAX) 0.25 MG TABLET    Take 1 tablet (0.25 mg total) by mouth at bedtime.   CETIRIZINE (ZYRTEC) 10 MG TABLET    Take 10 mg by mouth daily.   CLOTRIMAZOLE (LOTRIMIN) 1 % CREAM    Apply 1 application topically daily as needed.   FLUOXETINE (PROZAC) 20 MG CAPSULE    Take 1 capsule (20 mg total) by mouth daily.   FUROSEMIDE (LASIX) 20 MG TABLET    Take 20 mg by mouth as needed.    HYDROXYZINE (VISTARIL) 50 MG CAPSULE    TAKE 1 CAPSULE (50 MG TOTAL) BY MOUTH AT BEDTIME AS NEEDED (SLEEP).   INSULIN GLARGINE (LANTUS SOLOSTAR) 100 UNIT/ML SOLOSTAR PEN    Inject 20 Units into the skin daily at 10 pm. For diabetes   LEVOTHYROXINE (SYNTHROID, LEVOTHROID) 112 MCG TABLET    Take 1 tablet (112 mcg total) by mouth daily before breakfast.   ONETOUCH VERIO TEST STRIP    Check blood sugar daily.   ROSUVASTATIN (CRESTOR) 20 MG TABLET    Take 1 tablet (20 mg total) by mouth daily.   SITAGLIPTIN (JANUVIA) 50 MG TABLET    Take 1 tablet (50 mg total) by mouth daily.   TRIAMCINOLONE LOTION (KENALOG) 0.1 %    Apply 1 application topically daily. To scalp and rash  Modified Medications   No medications on file  Discontinued Medications   No medications on file     Physical Exam:  Vitals:   12/30/18 1105  BP: 120/80  Pulse: 65  Temp: 98 F (36.7 C)  TempSrc: Oral  SpO2: 97%  Weight: 198 lb (89.8 kg)  Height: 5\' 4"  (1.626 m)   Body mass index is 33.99 kg/m.  Physical Exam Constitutional:      Appearance: She is well-developed.  HENT:     Head: Normocephalic and atraumatic.     Nose: Nose normal.  Neck:     Musculoskeletal: Normal range of motion and neck supple.  Cardiovascular:     Rate and Rhythm: Normal rate and regular rhythm.     Heart sounds: Murmur (1/6 SEM) present.  Pulmonary:      Effort: Pulmonary effort is normal. No respiratory distress.     Breath sounds: Normal breath sounds. No stridor. No wheezing or rales.  Chest:     Chest wall: No tenderness.  Abdominal:     General: Abdomen is flat.     Palpations: Abdomen is soft.  Skin:    General: Skin  is warm and dry.     Findings: Wound present. No rash.     Comments: 5 cm x 2.5 cm noted to medial lower leg. No erythema or drainage. 50% yellow base and 50% red.   Neurological:     Mental Status: She is alert and oriented to person, place, and time.  Psychiatric:        Speech: Speech normal.        Behavior: Behavior normal.        Thought Content: Thought content normal.        Judgment: Judgment normal.    Labs reviewed: Basic Metabolic Panel: Recent Labs    06/10/18 1140 08/31/18 1414 09/09/18 1019 12/28/18 1131  NA 141 138 141 142  K 4.6 4.9 4.7 5.0  CL 109 102 106 107  CO2 22 27 26 27   GLUCOSE 103* 159* 115 122*  BUN 24 25* 24 22  CREATININE 1.39* 1.54* 1.63* 1.44*  CALCIUM 9.2 9.2 9.2 9.1  TSH 0.15* 1.669 2.15  --    Liver Function Tests: Recent Labs    06/10/18 1140 09/09/18 1019 12/28/18 1131  AST 13 15 18   ALT 12 10 16   BILITOT 0.5 0.5 0.5  PROT 5.9* 6.4 6.2   No results for input(s): LIPASE, AMYLASE in the last 8760 hours. No results for input(s): AMMONIA in the last 8760 hours. CBC: Recent Labs    03/08/18 1006 08/31/18 1414  WBC 7.2 9.1  NEUTROABS 4,507 6.1  HGB 12.5 14.1  HCT 38.3 41.6  MCV 85.3 86.6  PLT 313 320   Lipid Panel: Recent Labs    06/10/18 1140 09/09/18 1019 12/28/18 1131  CHOL 190 226* 226*  HDL 41* 40* 47*  LDLCALC 122* 157* 145*  TRIG 152* 155* 195*  CHOLHDL 4.6 5.7* 4.8   TSH: Recent Labs    06/10/18 1140 08/31/18 1414 09/09/18 1019  TSH 0.15* 1.669 2.15   A1C: Lab Results  Component Value Date   HGBA1C 6.7 (H) 12/28/2018     Assessment/Plan 1. Mixed hyperlipidemia -was started on crestor at last OV, with improvement in LDL  but not to goal. Currently LDL 145, she has hx of CAD, diabetes and LDL should be less than 70, reports some cramping in legs and hand and suspect this is due to crestor. Will refer to lipid clinic at this time for advanced management of cholesterol. Encouraged to continue with diet modifications.  - AMB Referral to [facility]  2. GAD (generalized anxiety disorder) -continues to follow up with psychiatrist. Maintained on prozac and using alprazolam PRN  3. Type 2 diabetes mellitus with complication, with long-term current use of insulin (HCC) -A1c up to 6.7 at this time. Continue dietary modifications. Will not adjust insulin at this time and to continue [place] daily   4. CKD (chronic kidney disease) stage 3, GFR 30-59 ml/min (HCC) Lab reviewed with pt, stable at this time. Encourage proper hydration and to avoid NSAIDS (Aleve, Advil, Motrin, Ibuprofen)    5. Wound of right lower extremity, subsequent encounter Overall improving but occurring slowly. Cellulitis noted during ED visit which has resolved. To apply xeroform to wound and cover with dressing. To changes xeroform every 3 days or sooner if needed To follow up wound in 2 weeks.    Next appt: 2 weeks for wound check. 3 months for routine follow up, labs prior  Kryslyn Helbig K. Biagio Borg  [facility] [phone]

## 2018-12-30 NOTE — Patient Instructions (Signed)
Jody [name] , Thank you for taking time to come for your Medicare Wellness Visit. I appreciate your ongoing commitment to your health goals. Please review the following plan we discussed and let me know if I can assist you in the future.   Screening recommendations/referrals: Colonoscopy: cologuard will be sent to your home to complete.  Mammogram: order placed Bone Density: orders placed Recommended yearly ophthalmology/optometry visit for glaucoma screening and checkup Recommended yearly dental visit for hygiene and checkup  Vaccinations: Influenza vaccine- up to date Pneumococcal vaccine - up to date Tdap vaccine - up to date Shingles vaccine- will send to pharmacy  Advanced directives: please complete advanced directives and bring back so we can add to your chart  Conditions/risks identified: increased BMI- needs to lose weight and get active- this will help with reducing a1C   Next appointment: 1 YEAR FOR AWV   Preventive Care 65 Years and Older, Female Preventive care refers to lifestyle choices and visits with your health care provider that can promote health and wellness. What does preventive care include?  A yearly physical exam. This is also called an annual well check.  Dental exams once or twice a year.  Routine eye exams. Ask your health care provider how often you should have your eyes checked.  Personal lifestyle choices, including:  Daily care of your teeth and gums.  Regular physical activity.  Eating a healthy diet.  Avoiding tobacco and drug use.  Limiting alcohol use.  Practicing safe sex.  Taking low-dose aspirin every day.  Taking vitamin and mineral supplements as recommended by your health care provider. What happens during an annual well check? The services and screenings done by your health care provider during your annual well check will depend on your age, overall health, lifestyle risk factors, and family history of disease. Counseling    Your health care provider may ask you questions about your:  Alcohol use.  Tobacco use.  Drug use.  Emotional well-being.  Home and relationship well-being.  Sexual activity.  Eating habits.  History of falls.  Memory and ability to understand (cognition).  Work and work Astronomer.  Reproductive health. Screening  You may have the following tests or measurements:  Height, weight, and BMI.  Blood pressure.  Lipid and cholesterol levels. These may be checked every 5 years, or more frequently if you are over 48 years old.  Skin check.  Lung cancer screening. You may have this screening every year starting at age 20 if you have a 30-pack-year history of smoking and currently smoke or have quit within the past 15 years.  Fecal occult blood test (FOBT) of the stool. You may have this test every year starting at age 37.  Flexible sigmoidoscopy or colonoscopy. You may have a sigmoidoscopy every 5 years or a colonoscopy every 10 years starting at age 44.  Hepatitis C blood test.  Hepatitis B blood test.  Sexually transmitted disease (STD) testing.  Diabetes screening. This is done by checking your blood sugar (glucose) after you have not eaten for a while (fasting). You may have this done every 1-3 years.  Bone density scan. This is done to screen for osteoporosis. You may have this done starting at age 44.  Mammogram. This may be done every 1-2 years. Talk to your health care provider about how often you should have regular mammograms. Talk with your health care provider about your test results, treatment options, and if necessary, the need for more tests. Vaccines  Your  health care provider may recommend certain vaccines, such as:  Influenza vaccine. This is recommended every year.  Tetanus, diphtheria, and acellular pertussis (Tdap, Td) vaccine. You may need a Td booster every 10 years.  Zoster vaccine. You may need this after age 40.  Pneumococcal 13-valent  conjugate (PCV13) vaccine. One dose is recommended after age 41.  Pneumococcal polysaccharide (PPSV23) vaccine. One dose is recommended after age 13. Talk to your health care provider about which screenings and vaccines you need and how often you need them. This information is not intended to replace advice given to you by your health care provider. Make sure you discuss any questions you have with your health care provider. Document Released: 12/20/2015 Document Revised: 08/12/2016 Document Reviewed: 09/24/2015 Elsevier Interactive Patient Education  2017 [place] Prevention in the Home Falls can cause injuries. They can happen to people of all ages. There are many things you can do to make your home safe and to help prevent falls. What can I do on the outside of my home?  Regularly fix the edges of walkways and driveways and fix any cracks.  Remove anything that might make you trip as you walk through a door, such as a raised step or threshold.  Trim any bushes or trees on the path to your home.  Use bright outdoor lighting.  Clear any walking paths of anything that might make someone trip, such as rocks or tools.  Regularly check to see if handrails are loose or broken. Make sure that both sides of any steps have handrails.  Any raised decks and porches should have guardrails on the edges.  Have any leaves, snow, or ice cleared regularly.  Use sand or salt on walking paths during winter.  Clean up any spills in your garage right away. This includes oil or grease spills. What can I do in the bathroom?  Use night lights.  Install grab bars by the toilet and in the tub and shower. Do not use towel bars as grab bars.  Use non-skid mats or decals in the tub or shower.  If you need to sit down in the shower, use a plastic, non-slip stool.  Keep the floor dry. Clean up any water that spills on the floor as soon as it happens.  Remove soap buildup in the tub or  shower regularly.  Attach bath mats securely with double-sided non-slip rug tape.  Do not have throw rugs and other things on the floor that can make you trip. What can I do in the bedroom?  Use night lights.  Make sure that you have a light by your bed that is easy to reach.  Do not use any sheets or blankets that are too big for your bed. They should not hang down onto the floor.  Have a firm chair that has side arms. You can use this for support while you get dressed.  Do not have throw rugs and other things on the floor that can make you trip. What can I do in the kitchen?  Clean up any spills right away.  Avoid walking on wet floors.  Keep items that you use a lot in easy-to-reach places.  If you need to reach something above you, use a strong step stool that has a grab bar.  Keep electrical cords out of the way.  Do not use floor polish or wax that makes floors slippery. If you must use wax, use non-skid floor wax.  Do not  have throw rugs and other things on the floor that can make you trip. What can I do with my stairs?  Do not leave any items on the stairs.  Make sure that there are handrails on both sides of the stairs and use them. Fix handrails that are broken or loose. Make sure that handrails are as long as the stairways.  Check any carpeting to make sure that it is firmly attached to the stairs. Fix any carpet that is loose or worn.  Avoid having throw rugs at the top or bottom of the stairs. If you do have throw rugs, attach them to the floor with carpet tape.  Make sure that you have a light switch at the top of the stairs and the bottom of the stairs. If you do not have them, ask someone to add them for you. What else can I do to help prevent falls?  Wear shoes that:  Do not have high heels.  Have rubber bottoms.  Are comfortable and fit you well.  Are closed at the toe. Do not wear sandals.  If you use a stepladder:  Make sure that it is fully  opened. Do not climb a closed stepladder.  Make sure that both sides of the stepladder are locked into place.  Ask someone to hold it for you, if possible.  Clearly mark and make sure that you can see:  Any grab bars or handrails.  First and last steps.  Where the edge of each step is.  Use tools that help you move around (mobility aids) if they are needed. These include:  Canes.  Walkers.  Scooters.  Crutches.  Turn on the lights when you go into a dark area. Replace any light bulbs as soon as they burn out.  Set up your furniture so you have a clear path. Avoid moving your furniture around.  If any of your floors are uneven, fix them.  If there are any pets around you, be aware of where they are.  Review your medicines with your doctor. Some medicines can make you feel dizzy. This can increase your chance of falling. Ask your doctor what other things that you can do to help prevent falls. This information is not intended to replace advice given to you by your health care provider. Make sure you discuss any questions you have with your health care provider. Document Released: 09/19/2009 Document Revised: 04/30/2016 Document Reviewed: 12/28/2014 Elsevier Interactive Patient Education  2017 [employer].

## 2019-01-06 ENCOUNTER — Encounter: Payer: Self-pay | Admitting: Nurse Practitioner

## 2019-01-13 ENCOUNTER — Encounter: Payer: Self-pay | Admitting: Nurse Practitioner

## 2019-01-13 ENCOUNTER — Other Ambulatory Visit: Payer: Medicare Other

## 2019-01-13 ENCOUNTER — Ambulatory Visit (INDEPENDENT_AMBULATORY_CARE_PROVIDER_SITE_OTHER): Payer: Medicare Other | Admitting: Nurse Practitioner

## 2019-01-13 VITALS — BP 138/80 | HR 82 | Temp 98.7°F | Resp 10 | Ht 64.0 in | Wt 200.0 lb

## 2019-01-13 DIAGNOSIS — F411 Generalized anxiety disorder: Secondary | ICD-10-CM

## 2019-01-13 DIAGNOSIS — N183 Chronic kidney disease, stage 3 unspecified: Secondary | ICD-10-CM

## 2019-01-13 DIAGNOSIS — S81801D Unspecified open wound, right lower leg, subsequent encounter: Secondary | ICD-10-CM

## 2019-01-13 NOTE — Progress Notes (Signed)
Careteam: Patient Care Team: Sharon Seller, NP as PCP - General (Geriatric Medicine)  Advanced Directive information    Allergies  Allergen Reactions  . Metformin And Related Diarrhea  . Ultram [Tramadol] Other (See Comments)    falls  . Penicillins Rash    Chief Complaint  Patient presents with  . Follow-up    2 week wound follow-up      HPI: Patient is a 73 y.o. Jody [name] seen in the office today to follow up wound.  She has a slow healing wound to right medial lower leg.  Has been using xeroform routinely. Wound bed is red without increase in pain. No drainage or odor. Making progress but slow.   Pt is on prozac and has discussed with psych about increasing this but psychiatrist wanted to verify renal function first.   Review of Systems:  Review of Systems  Constitutional: Negative for chills, fever and weight loss.  HENT: Positive for hearing loss. Negative for ear pain and tinnitus.   Respiratory: Negative for cough and shortness of breath.   Cardiovascular: Negative for chest pain.  Gastrointestinal: Negative for abdominal pain, blood in stool, constipation and diarrhea.  Genitourinary: Negative for dysuria.  Skin:       Wound to right leg- slowing making progress  Neurological: Negative for tingling, weakness and headaches.  Psychiatric/Behavioral: Positive for depression. The patient is nervous/anxious and has insomnia.     Past Medical History:  Diagnosis Date  . Abnormal involuntary movement   . Acute cystitis   . Arthralgia of pelvic region and thigh   . Bilateral nonexudative age-related macular degeneration   . Bipolar disorder (HCC)   . Claustrophobia   . Combined form of senile cataract   . Coronary arteriosclerosis   . Decreased hearing   . Depression   . Diabetes (HCC)   . Displacement of lumbar intervertebral disc without myelopathy   . Dysuria   . Glaucoma   . Glycosuria   . Gout   . History of mania   . Hypercalcemia   .  Hyperglycemia   . Hypothyroidism   . Kidney disease   . Macular degeneration   . Nocturia   . 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 Osteoarthritis    pelvic region and thigh  . Palpitations   . Plantar fasciitis   . Psoriasis   . Solitary pulmonary nodule present on computed tomography of lung   . Speaking difficulty   . Spinal stenosis of lumbar region   . Stage 3 chronic renal impairment associated with type 2 diabetes mellitus (HCC)   . Thoracic and lumbosacral neuritis   . Tremor   . Vitreous degeneration    Past Surgical History:  Procedure Laterality Date  . APPENDECTOMY    . BACK SURGERY     rods placed  . carpel tunnel     right in 1996, left in 1994  . CATARACT EXTRACTION    . HYSTEROSCOPY  04/18/2013  . LAMINECTOMY  11/14/2009   L4-5 Lamfusion, [place] and repair dural tear  . NECK SURGERY     4-5 disc  . TOTAL HIP ARTHROPLASTY Right    Social History:   reports that she has never smoked. She has never used smokeless tobacco. She reports that she does not drink alcohol or use drugs.  Family History  Problem Relation Age of Onset  . Peripheral Artery Disease Mother   . Cancer Father  Medications: Patient's Medications  New Prescriptions   No medications on file  Previous Medications   ALPRAZOLAM (XANAX) 0.25 MG TABLET    Take 1 tablet (0.25 mg total) by mouth at bedtime.   CETIRIZINE (ZYRTEC) 10 MG TABLET    Take 10 mg by mouth daily.   CLOTRIMAZOLE (LOTRIMIN) 1 % CREAM    Apply 1 application topically daily as needed.   FLUOXETINE (PROZAC) 20 MG CAPSULE    Take 1 capsule (20 mg total) by mouth daily.   FUROSEMIDE (LASIX) 20 MG TABLET    Take 20 mg by mouth as needed.    HYDROXYZINE (VISTARIL) 50 MG CAPSULE    TAKE 1 CAPSULE (50 MG TOTAL) BY MOUTH AT BEDTIME AS NEEDED (SLEEP).   INSULIN GLARGINE (LANTUS SOLOSTAR) 100 UNIT/ML SOLOSTAR PEN    Inject 20 Units into the skin daily at 10 pm. For diabetes    LEVOTHYROXINE (SYNTHROID, LEVOTHROID) 112 MCG TABLET    Take 1 tablet (112 mcg total) by mouth daily before breakfast.   ONETOUCH VERIO TEST STRIP    Check blood sugar daily.   ROSUVASTATIN (CRESTOR) 20 MG TABLET    Take 1 tablet (20 mg total) by mouth daily.   SITAGLIPTIN (JANUVIA) 50 MG TABLET    Take 1 tablet (50 mg total) by mouth daily.   TRIAMCINOLONE LOTION (KENALOG) 0.1 %    Apply 1 application topically daily. To scalp and rash  Modified Medications   No medications on file  Discontinued Medications   No medications on file     Physical Exam:  Vitals:   01/13/19 1054  BP: 138/80  Pulse: 82  Resp: 10  Temp: 98.7 F (37.1 C)  TempSrc: Oral  SpO2: 97%  Weight: 200 lb (90.7 kg)  Height: 5\' 4"  (1.626 m)   Body mass index is 34.33 kg/m.  Physical Exam Constitutional:      Appearance: She is well-developed.  HENT:     Head: Normocephalic and atraumatic.     Nose: Nose normal.  Neck:     Musculoskeletal: Normal range of motion and neck supple.  Cardiovascular:     Rate and Rhythm: Normal rate and regular rhythm.     Heart sounds: Murmur (1/6 SEM) present.  Pulmonary:     Effort: Pulmonary effort is normal. No respiratory distress.     Breath sounds: Normal breath sounds. No stridor. No wheezing or rales.  Chest:     Chest wall: No tenderness.  Abdominal:     General: Abdomen is flat.     Palpations: Abdomen is soft.  Skin:    General: Skin is warm and dry.     Findings: Wound present. No rash.     Comments: 4 cm x 2 cm noted to medial lower leg. No erythema or drainage. 85% red, 15% yellow  Neurological:     Mental Status: She is alert and oriented to person, place, and time.  Psychiatric:        Speech: Speech normal.        Behavior: Behavior normal.        Thought Content: Thought content normal.        Judgment: Judgment normal.     Labs reviewed: Basic Metabolic Panel: Recent Labs    06/10/18 1140 08/31/18 1414 09/09/18 1019 12/28/18 1131    NA 141 138 141 142  K 4.6 4.9 4.7 5.0  CL 109 102 106 107  CO2 22 27 26 27   GLUCOSE 103* 159* 115  122*  BUN 24 25* 24 22  CREATININE 1.39* 1.54* 1.63* 1.44*  CALCIUM 9.2 9.2 9.2 9.1  TSH 0.15* 1.669 2.15  --    Liver Function Tests: Recent Labs    06/10/18 1140 09/09/18 1019 12/28/18 1131  AST 13 15 18   ALT 12 10 16   BILITOT 0.5 0.5 0.5  PROT 5.9* 6.4 6.2   No results for input(s): LIPASE, AMYLASE in the last 8760 hours. No results for input(s): AMMONIA in the last 8760 hours. CBC: Recent Labs    03/08/18 1006 08/31/18 1414  WBC 7.2 9.1  NEUTROABS 4,507 6.1  HGB 12.5 14.1  HCT 38.3 Jody.6  MCV 85.3 86.6  PLT 313 320   Lipid Panel: Recent Labs    06/10/18 1140 09/09/18 1019 12/28/18 1131  CHOL 190 226* 226*  HDL Jody* 40* 47*  LDLCALC 122* 157* 145*  TRIG 152* 155* 195*  CHOLHDL 4.6 5.7* 4.8   TSH: Recent Labs    06/10/18 1140 08/31/18 1414 09/09/18 1019  TSH 0.15* 1.669 2.15   A1C: Lab Results  Component Value Date   HGBA1C 6.7 (H) 12/28/2018     Assessment/Plan 1. CKD (chronic kidney disease) stage 3, GFR 30-59 ml/min (HCC) -Encourage proper hydration and to avoid NSAIDS (Aleve, Advil, Motrin, Ibuprofen), daughter has copy of labs  2. Wound of right lower extremity, subsequent encounter -improving. Daughter will continue to help with wound care. Continues on xeroform. Plans to measure wound weekly to make sure it is healing. Will notify if wound gets worse/fails to improve, redness around wound, drainage occurs, wound bed changes color.  3. GAD (generalized anxiety disorder) Continues to follow up with psych, plans to make changes to medication at next appt.   Next appt: 03/28/2019, sooner if needed  Janene Harvey. Biagio Borg  [facility] [phone]

## 2019-01-13 NOTE — Patient Instructions (Addendum)
Wound is 2X4 today, continue to monitor. Notify if wound stops making progress or worsens, drainage, pain, heat, if the wound bed becomes discolored (red is good) Make appt if any changes occur.

## 2019-01-18 LAB — COLOGUARD
COLOGUARD: NEGATIVE
Cologuard: NEGATIVE

## 2019-01-25 ENCOUNTER — Encounter: Payer: Self-pay | Admitting: *Deleted

## 2019-02-03 ENCOUNTER — Telehealth: Payer: Self-pay

## 2019-02-03 LAB — HM DIABETES EYE EXAM

## 2019-02-03 NOTE — Telephone Encounter (Signed)
Spoke with patient and provided her with her negative cologuard results

## 2019-02-08 ENCOUNTER — Encounter: Payer: Self-pay | Admitting: *Deleted

## 2019-02-15 ENCOUNTER — Other Ambulatory Visit: Payer: Self-pay | Admitting: *Deleted

## 2019-02-15 DIAGNOSIS — Z794 Long term (current) use of insulin: Principal | ICD-10-CM

## 2019-02-15 DIAGNOSIS — E118 Type 2 diabetes mellitus with unspecified complications: Secondary | ICD-10-CM

## 2019-02-15 MED ORDER — SITAGLIPTIN PHOSPHATE 50 MG PO TABS: 50.0000 mg | ORAL_TABLET | Freq: Every day | ORAL | 1 refills | Status: AC

## 2019-02-15 NOTE — Telephone Encounter (Signed)
[facility]

## 2019-02-25 ENCOUNTER — Other Ambulatory Visit: Payer: Self-pay | Admitting: Nurse Practitioner

## 2019-03-01 ENCOUNTER — Ambulatory Visit: Payer: Medicare Other

## 2019-03-01 ENCOUNTER — Other Ambulatory Visit: Payer: Medicare Other

## 2019-03-06 ENCOUNTER — Other Ambulatory Visit: Payer: Self-pay | Admitting: *Deleted

## 2019-03-06 MED ORDER — LEVOTHYROXINE SODIUM 112 MCG PO TABS: 112.0000 ug | ORAL_TABLET | Freq: Every day | ORAL | 0 refills | Status: AC

## 2019-03-06 NOTE — Telephone Encounter (Signed)
[facility]  

## 2019-03-07 ENCOUNTER — Telehealth: Payer: Self-pay

## 2019-03-07 NOTE — Telephone Encounter (Signed)
Patient has moved and will not be able to establish care here. She just moved yesterday and I advised to establish care with a PCP and have her cholesterol evaluated. Patient requested to cancel her appointment.

## 2019-03-08 ENCOUNTER — Ambulatory Visit: Payer: Medicare Other | Admitting: Internal Medicine

## 2019-03-27 ENCOUNTER — Encounter: Payer: Self-pay | Admitting: Nurse Practitioner

## 2019-03-28 ENCOUNTER — Other Ambulatory Visit: Payer: 59

## 2019-03-31 ENCOUNTER — Ambulatory Visit: Payer: 59 | Admitting: Nurse Practitioner

## 2019-04-07 ENCOUNTER — Ambulatory Visit: Payer: 59 | Admitting: Internal Medicine

## 2019-04-23 ENCOUNTER — Other Ambulatory Visit: Payer: Self-pay | Admitting: Nurse Practitioner

## 2019-04-25 ENCOUNTER — Other Ambulatory Visit: Payer: Medicare Other

## 2019-04-25 ENCOUNTER — Ambulatory Visit: Payer: Medicare Other

## 2019-04-27 ENCOUNTER — Other Ambulatory Visit: Payer: Self-pay | Admitting: Nurse Practitioner

## 2019-04-27 ENCOUNTER — Encounter: Payer: Self-pay | Admitting: Nurse Practitioner

## 2019-04-27 DIAGNOSIS — F411 Generalized anxiety disorder: Secondary | ICD-10-CM

## 2019-04-27 MED ORDER — ALPRAZOLAM 0.25 MG PO TABS: 0.2500 mg | ORAL_TABLET | Freq: Every day | ORAL | 0 refills | Status: AC

## 2019-04-27 MED ORDER — HYDROXYZINE PAMOATE 50 MG PO CAPS: 50.0000 mg | ORAL_CAPSULE | Freq: Every evening | ORAL | 0 refills | Status: AC | PRN

## 2019-04-27 NOTE — Telephone Encounter (Signed)
Last refill on xanax 01/19/2019    [facility] verified and last refill was by  Horatio Pel DNP on 01/19/2019  Pharmacy address is correct

## 2019-05-03 ENCOUNTER — Other Ambulatory Visit: Payer: Self-pay

## 2019-05-03 ENCOUNTER — Encounter: Payer: Self-pay | Admitting: Nurse Practitioner

## 2019-05-03 DIAGNOSIS — E118 Type 2 diabetes mellitus with unspecified complications: Secondary | ICD-10-CM

## 2019-05-03 MED ORDER — "PEN NEEDLES 3/16"" 31G X 5 MM MISC": 0 refills | Status: AC

## 2019-05-03 MED ORDER — INSULIN GLARGINE 100 UNIT/ML SOLOSTAR PEN: 20.0000 [IU] | PEN_INJECTOR | Freq: Every day | SUBCUTANEOUS | 0 refills | Status: AC

## 2019-05-03 NOTE — Telephone Encounter (Signed)
Jody [name] please advise. This patient is no longer under your care, per care team report.

## 2019-05-03 NOTE — Telephone Encounter (Signed)
I will be willing to send a 30 day supply of this medication for her but will not be able to continue to do this. I encourage her to establish care with someone area.

## 2019-05-17 ENCOUNTER — Telehealth: Payer: Self-pay

## 2019-05-17 NOTE — Telephone Encounter (Signed)
Yes - thank you

## 2019-05-17 NOTE — Telephone Encounter (Signed)
Message left on clinical intake voicemail:   Patient left message stating she has UTI. Patient with blood in urine today and burning while urinating x 2 days. Patient would like an antibiotic sent to her pharmacy.  I returned call and informed patient that she is no longer a patient at [facility] and we can not call in an antibiotic for her. Patient will need to consult with her new PCP or Urgent Care to seek the medical attention she needs. Patient verbalized understanding.  Message will be routed to previous PCP: Sherrie Mustache, NP to confirm course of action was appropriate or to give additional recommendations.

## 2019-05-26 ENCOUNTER — Other Ambulatory Visit: Payer: Self-pay | Admitting: Family

## 2019-05-29 ENCOUNTER — Other Ambulatory Visit: Payer: Self-pay | Admitting: Nurse Practitioner

## 2019-05-29 DIAGNOSIS — E118 Type 2 diabetes mellitus with unspecified complications: Secondary | ICD-10-CM

## 2019-05-29 DIAGNOSIS — Z794 Long term (current) use of insulin: Secondary | ICD-10-CM

## 2019-05-31 ENCOUNTER — Other Ambulatory Visit: Payer: Self-pay | Admitting: Family

## 2019-06-04 ENCOUNTER — Other Ambulatory Visit: Payer: Self-pay | Admitting: Family

## 2019-06-06 ENCOUNTER — Other Ambulatory Visit: Payer: Self-pay | Admitting: Family

## 2019-06-06 ENCOUNTER — Other Ambulatory Visit: Payer: Self-pay | Admitting: Nurse Practitioner

## 2019-06-06 DIAGNOSIS — F411 Generalized anxiety disorder: Secondary | ICD-10-CM

## 2019-06-06 DIAGNOSIS — E118 Type 2 diabetes mellitus with unspecified complications: Secondary | ICD-10-CM

## 2019-06-07 ENCOUNTER — Encounter: Payer: Self-pay | Admitting: Nurse Practitioner

## 2019-06-08 ENCOUNTER — Other Ambulatory Visit: Payer: Self-pay | Admitting: Nurse Practitioner

## 2019-06-08 ENCOUNTER — Other Ambulatory Visit: Payer: Self-pay | Admitting: Family

## 2019-06-08 DIAGNOSIS — E118 Type 2 diabetes mellitus with unspecified complications: Secondary | ICD-10-CM

## 2019-06-08 DIAGNOSIS — Z794 Long term (current) use of insulin: Secondary | ICD-10-CM

## 2019-07-12 ENCOUNTER — Other Ambulatory Visit: Payer: Self-pay | Admitting: Family

## 2019-07-12 ENCOUNTER — Other Ambulatory Visit: Payer: Self-pay | Admitting: Nurse Practitioner

## 2019-07-12 DIAGNOSIS — E118 Type 2 diabetes mellitus with unspecified complications: Secondary | ICD-10-CM

## 2019-08-03 ENCOUNTER — Other Ambulatory Visit: Payer: Self-pay | Admitting: Nurse Practitioner

## 2019-08-17 ENCOUNTER — Other Ambulatory Visit: Payer: Self-pay | Admitting: Family

## 2019-09-14 ENCOUNTER — Other Ambulatory Visit: Payer: Self-pay | Admitting: Family

## 2019-09-14 ENCOUNTER — Other Ambulatory Visit: Payer: Self-pay | Admitting: Nurse Practitioner

## 2019-09-14 DIAGNOSIS — E118 Type 2 diabetes mellitus with unspecified complications: Secondary | ICD-10-CM

## 2019-09-27 IMAGING — US US RENAL
1 series · 14 of 25 positions shown · non-contrast
Comparison: None.

CLINICAL DATA: 71-year-old female with a history of abnormal kidney
function

EXAM:
RENAL / URINARY TRACT ULTRASOUND COMPLETE

[Series 1: us renal · 0.23mm/px · 14 of 38 slices shown]
[im 1/38]
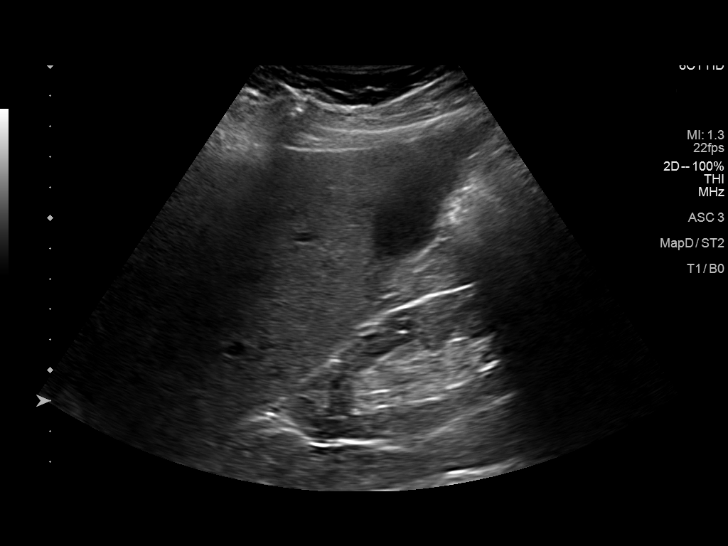
[im 4/38]
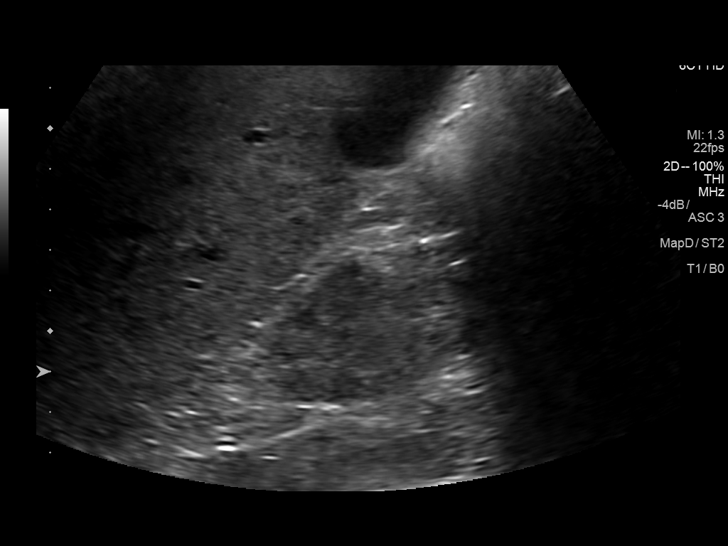
[im 7/38]
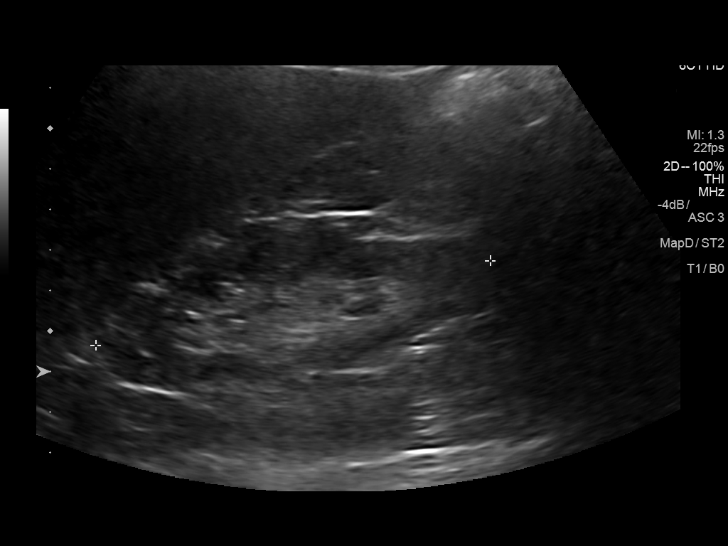
[im 10/38]
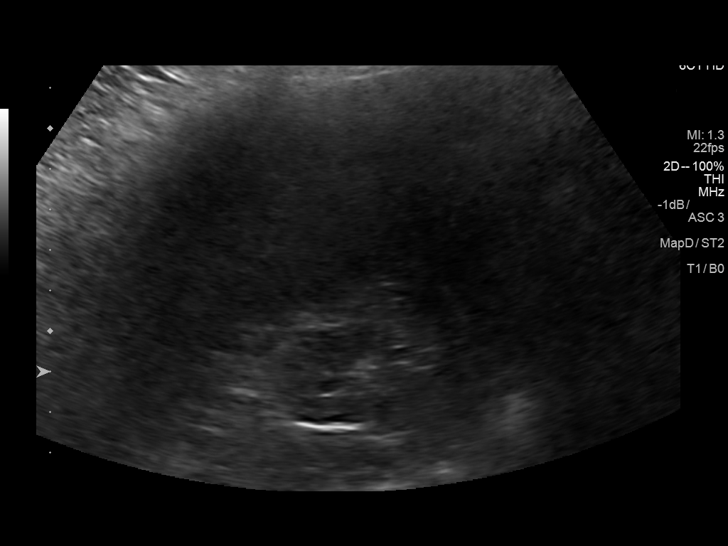
[im 13/38]
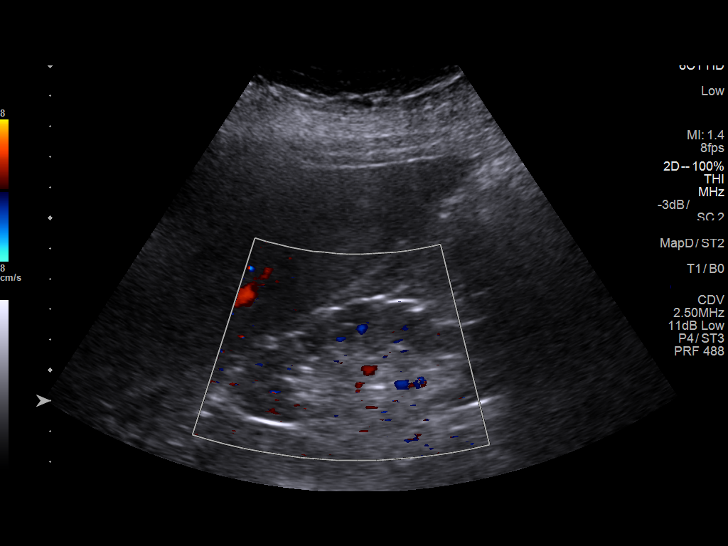
[im 14/38]
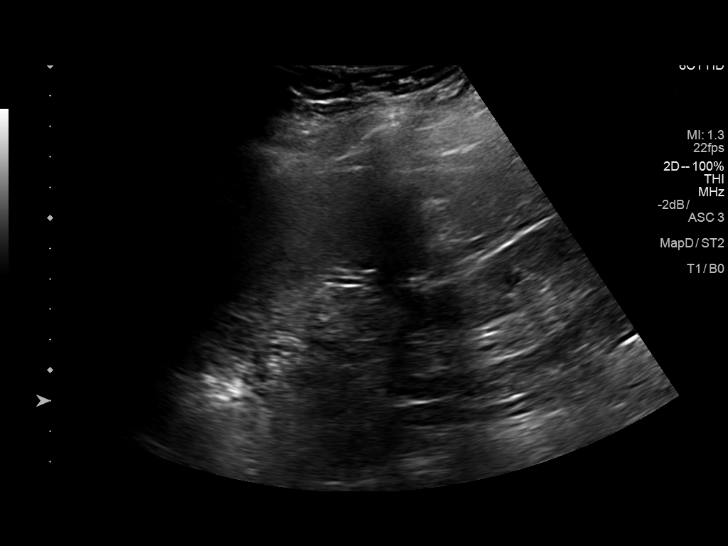
[im 17/38]
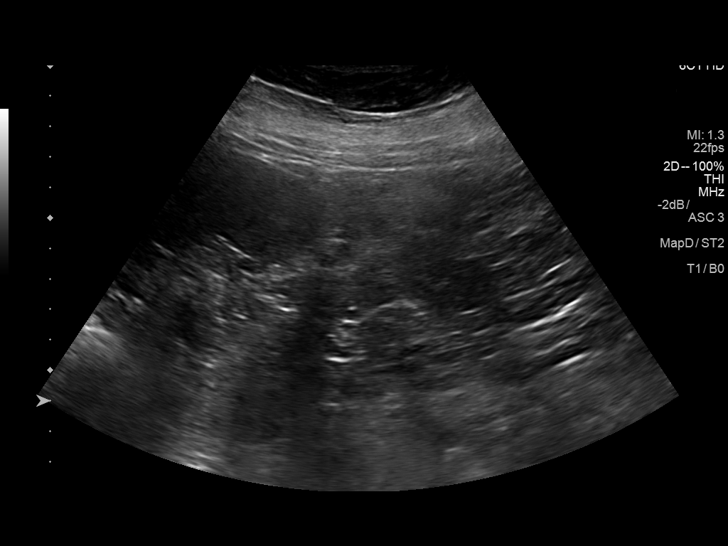
[im 21/38]
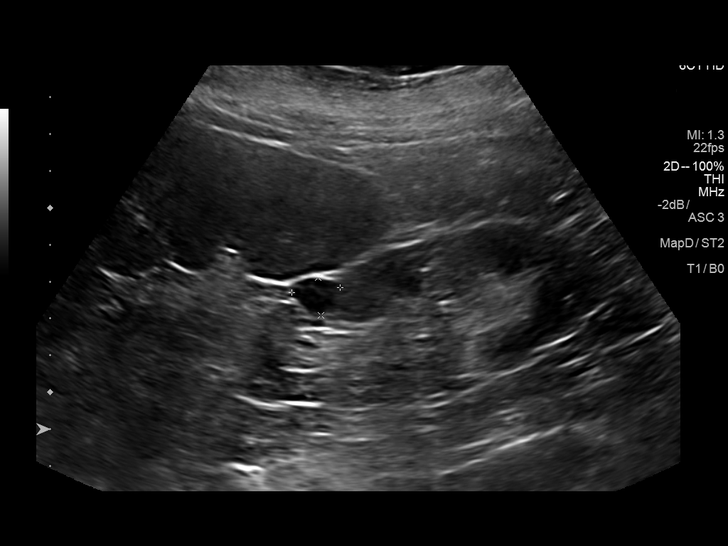
[im 24/38]
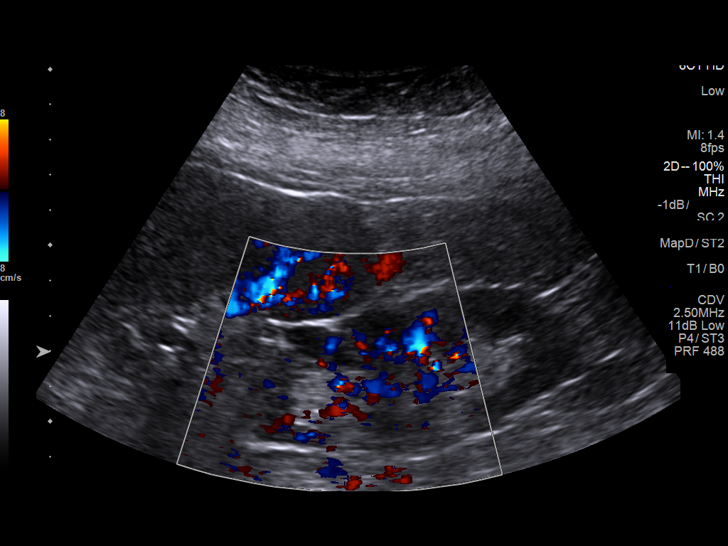
[im 25/38]
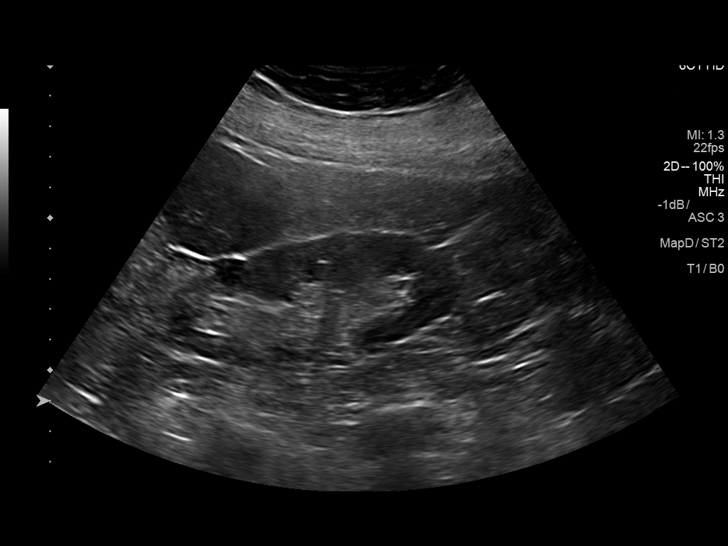
[im 28/38]
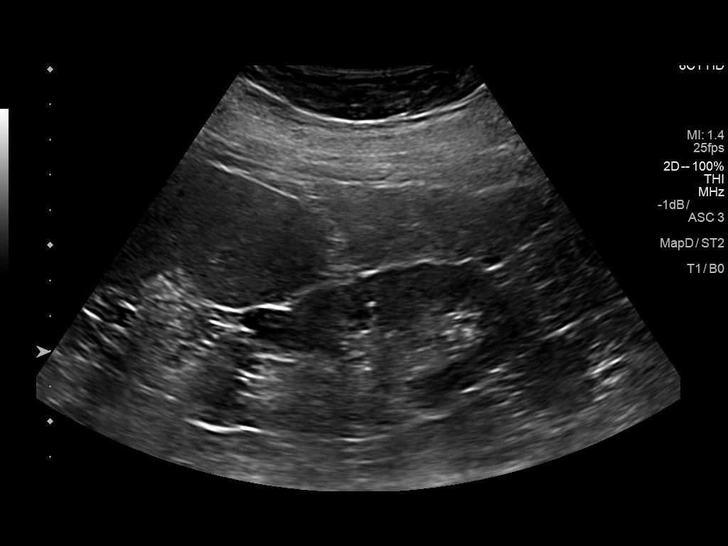
[im 31/38]
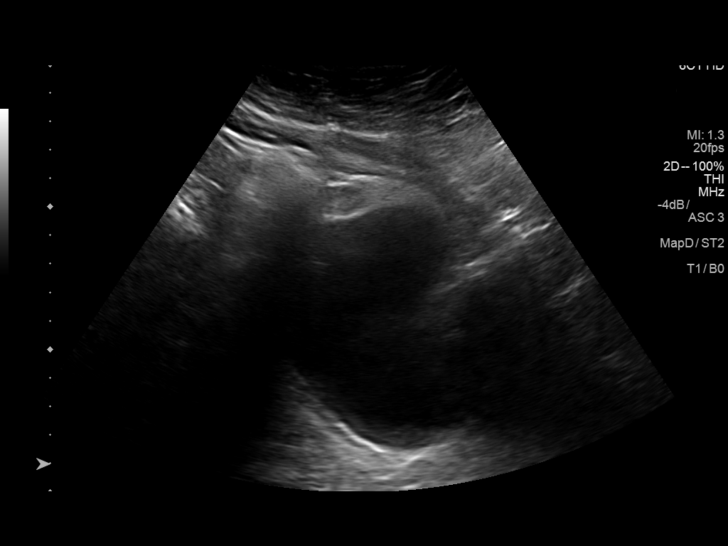
[im 34/38]
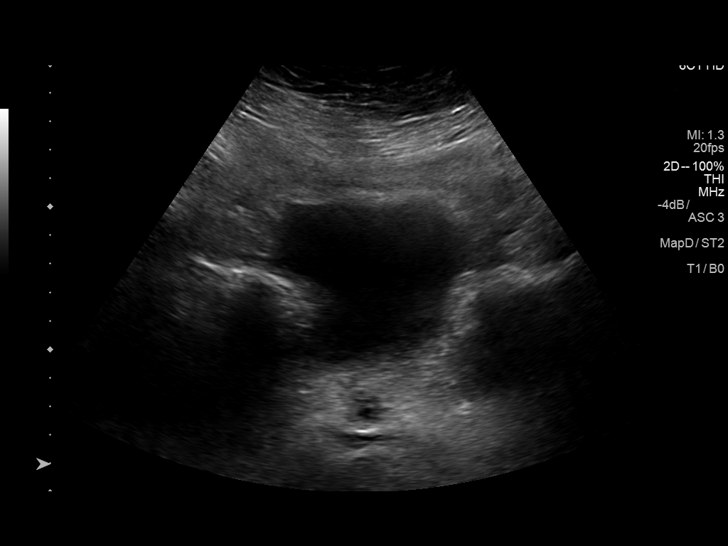
[im 38/38]
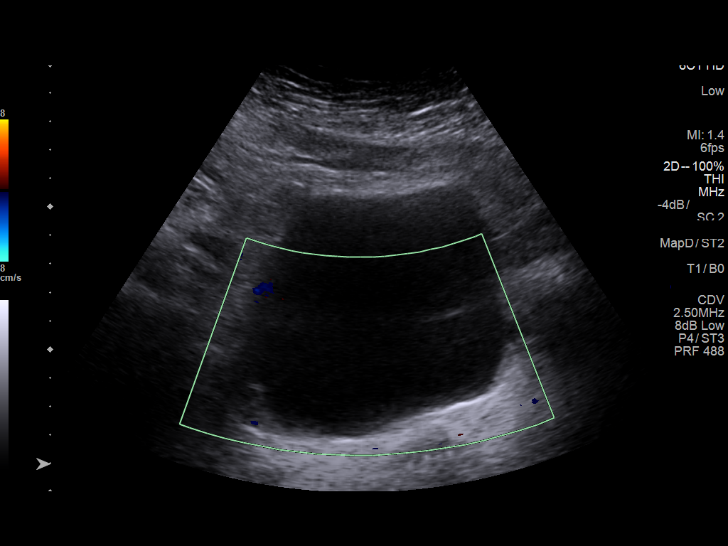

[14 of 25 positions shown; findings below may reference images not displayed]

FINDINGS: Right Kidney:

Length: 10.0 cm. Cortical thinning of the right kidney with no
hydronephrosis. No focal lesion. No echogenic foci.

Left Kidney:

Length: 10.4 cm. Cortical thinning of the left kidney. Anechoic
structure at the superior left kidney measures 13 mm x 10 mm x 11
mm, compatible with a cyst.

Bladder:

Appears normal for degree of bladder distention.
IMPRESSION: No evidence of hydronephrosis of the bilateral kidneys.

Bilateral renal cortical thinning suggesting medical renal disease.

Left renal cyst.

## 2019-10-07 ENCOUNTER — Other Ambulatory Visit: Payer: Self-pay | Admitting: Nurse Practitioner

## 2019-10-07 DIAGNOSIS — E118 Type 2 diabetes mellitus with unspecified complications: Secondary | ICD-10-CM

## 2019-10-07 DIAGNOSIS — Z794 Long term (current) use of insulin: Secondary | ICD-10-CM

## 2019-10-08 DEATH — deceased

## 2019-11-24 ENCOUNTER — Other Ambulatory Visit: Payer: Self-pay | Admitting: Nurse Practitioner

## 2019-11-24 DIAGNOSIS — E782 Mixed hyperlipidemia: Secondary | ICD-10-CM

## 2020-01-01 ENCOUNTER — Encounter: Payer: 59 | Admitting: Nurse Practitioner

## 2022-04-21 ENCOUNTER — Telehealth: Payer: Self-pay

## 2022-04-21 NOTE — Telephone Encounter (Signed)
Unable to reach patient about Cologuard screening or leave message has not been seen since 06/2019 and No [facility] PCP assigned to patients chart. MLP ?

## 2022-04-21 NOTE — Telephone Encounter (Signed)
Considering that this is not our patient, we will need to contact [employer] and have them remove patient from our follow-up list  ?
